# Patient Record
Sex: Female | Born: 1950 | Race: White | Hispanic: No | Marital: Married | Smoking: Never smoker
Health system: Southern US, Community
[De-identification: ages and names within clinical notes are randomized; demographics above are authoritative.]

## PROBLEM LIST (undated history)

## (undated) DIAGNOSIS — Z780 Asymptomatic menopausal state: Secondary | ICD-10-CM

## (undated) DIAGNOSIS — G4730 Sleep apnea, unspecified: Secondary | ICD-10-CM

## (undated) DIAGNOSIS — M26609 Unspecified temporomandibular joint disorder, unspecified side: Secondary | ICD-10-CM

## (undated) DIAGNOSIS — S32009A Unspecified fracture of unspecified lumbar vertebra, initial encounter for closed fracture: Secondary | ICD-10-CM

## (undated) DIAGNOSIS — G629 Polyneuropathy, unspecified: Secondary | ICD-10-CM

## (undated) DIAGNOSIS — T7840XA Allergy, unspecified, initial encounter: Secondary | ICD-10-CM

## (undated) HISTORY — PX: DILATION AND CURETTAGE OF UTERUS: SHX78

## (undated) HISTORY — DX: Allergy, unspecified, initial encounter: T78.40XA

## (undated) HISTORY — DX: Asymptomatic menopausal state: Z78.0

## (undated) HISTORY — PX: TONSILLECTOMY: SUR1361

## (undated) HISTORY — DX: Polyneuropathy, unspecified: G62.9

## (undated) HISTORY — DX: Unspecified fracture of unspecified lumbar vertebra, initial encounter for closed fracture: S32.009A

## (undated) HISTORY — DX: Unspecified temporomandibular joint disorder, unspecified side: M26.609

## (undated) HISTORY — DX: Sleep apnea, unspecified: G47.30

---

## 2003-02-18 ENCOUNTER — Encounter
Admission: RE | Admit: 2003-02-18 | Discharge: 2003-03-15 | Payer: Self-pay | Admitting: Physical Medicine and Rehabilitation

## 2008-02-09 DIAGNOSIS — G4730 Sleep apnea, unspecified: Secondary | ICD-10-CM

## 2008-02-09 HISTORY — DX: Sleep apnea, unspecified: G47.30

## 2013-02-09 ENCOUNTER — Encounter (INDEPENDENT_AMBULATORY_CARE_PROVIDER_SITE_OTHER): Payer: Self-pay

## 2013-02-09 ENCOUNTER — Ambulatory Visit (INDEPENDENT_AMBULATORY_CARE_PROVIDER_SITE_OTHER): Payer: BC Managed Care – PPO | Admitting: Family Medicine

## 2013-02-09 ENCOUNTER — Encounter: Payer: Self-pay | Admitting: Family Medicine

## 2013-02-09 ENCOUNTER — Ambulatory Visit (INDEPENDENT_AMBULATORY_CARE_PROVIDER_SITE_OTHER): Payer: BC Managed Care – PPO

## 2013-02-09 VITALS — BP 129/73 | HR 81 | Temp 98.2°F | Ht 65.0 in | Wt 188.0 lb

## 2013-02-09 DIAGNOSIS — N951 Menopausal and female climacteric states: Secondary | ICD-10-CM

## 2013-02-09 DIAGNOSIS — G4730 Sleep apnea, unspecified: Secondary | ICD-10-CM | POA: Insufficient documentation

## 2013-02-09 DIAGNOSIS — Z780 Asymptomatic menopausal state: Secondary | ICD-10-CM | POA: Insufficient documentation

## 2013-02-09 DIAGNOSIS — M542 Cervicalgia: Secondary | ICD-10-CM

## 2013-02-09 DIAGNOSIS — M26609 Unspecified temporomandibular joint disorder, unspecified side: Secondary | ICD-10-CM | POA: Insufficient documentation

## 2013-02-09 DIAGNOSIS — S32009A Unspecified fracture of unspecified lumbar vertebra, initial encounter for closed fracture: Secondary | ICD-10-CM | POA: Insufficient documentation

## 2013-02-09 MED ORDER — PREDNISONE 20 MG PO TABS: 60.0000 mg | ORAL_TABLET | Freq: Every day | ORAL | Status: DC

## 2013-02-09 NOTE — Patient Instructions (Signed)
Cervical Radiculopathy  Cervical radiculopathy happens when a nerve in the neck is pinched or bruised by a slipped (herniated) disk or by arthritic changes in the bones of the cervical spine. This can occur due to an injury or as part of the normal aging process. Pressure on the cervical nerves can cause pain or numbness that runs from your neck all the way down into your arm and fingers.  CAUSES   There are many possible causes, including:  · Injury.  · Muscle tightness in the neck from overuse.  · Swollen, painful joints (arthritis).  · Breakdown or degeneration in the bones and joints of the spine (spondylosis) due to aging.  · Bone spurs that may develop near the cervical nerves.  SYMPTOMS   Symptoms include pain, weakness, or numbness in the affected arm and hand. Pain can be severe or irritating. Symptoms may be worse when extending or turning the neck.  DIAGNOSIS   Your caregiver will ask about your symptoms and do a physical exam. He or she may test your strength and reflexes. X-rays, CT scans, and MRI scans may be needed in cases of injury or if the symptoms do not go away after a period of time. Electromyography (EMG) or nerve conduction testing may be done to study how your nerves and muscles are working.  TREATMENT   Your caregiver may recommend certain exercises to help relieve your symptoms. Cervical radiculopathy can, and often does, get better with time and treatment. If your problems continue, treatment options may include:  · Wearing a soft collar for short periods of time.  · Physical therapy to strengthen the neck muscles.  · Medicines, such as nonsteroidal anti-inflammatory drugs (NSAIDs), oral corticosteroids, or spinal injections.  · Surgery. Different types of surgery may be done depending on the cause of your problems.  HOME CARE INSTRUCTIONS   · Put ice on the affected area.  · Put ice in a plastic bag.  · Place a towel between your skin and the bag.  · Leave the ice on for 15-20 minutes,  03-04 times a day or as directed by your caregiver.  · If ice does not help, you can try using heat. Take a warm shower or bath, or use a hot water bottle as directed by your caregiver.  · You may try a gentle neck and shoulder massage.  · Use a flat pillow when you sleep.  · Only take over-the-counter or prescription medicines for pain, discomfort, or fever as directed by your caregiver.  · If physical therapy was prescribed, follow your caregiver's directions.  · If a soft collar was prescribed, use it as directed.  SEEK IMMEDIATE MEDICAL CARE IF:   · Your pain gets much worse and cannot be controlled with medicines.  · You have weakness or numbness in your hand, arm, face, or leg.  · You have a high fever or a stiff, rigid neck.  · You lose bowel or bladder control (incontinence).  · You have trouble with walking, balance, or speaking.  MAKE SURE YOU:   · Understand these instructions.  · Will watch your condition.  · Will get help right away if you are not doing well or get worse.  Document Released: 10/20/2000 Document Revised: 04/19/2011 Document Reviewed: 09/08/2010  Exit[employer]® Patient Information ©2014 [employer].

## 2013-02-09 NOTE — Progress Notes (Signed)
Patient ID: Kristen Becker, female   DOB: 11-18-1950, 63 y.o.   MRN: [id number] SUBJECTIVE: CC: Chief Complaint  Patient presents with  . Follow-up    trouble breathing with the cpap machine states the mask pulling her jaw pushing forward c/o pain in occipital area.   . Back Pain    also c/o back pain     HPI: Having neck pain at nights, radiates to the arms shoulders and back of the neck and even jaw sometimes and gets a headache. She attributes it to her CPAP mask. Difficulty turning her neck. Sees a dentist who recommends a mouth guard for TMJ as well and recommends seeinga  PCP in regards to the cpap mask.   Past Medical History  Diagnosis Date  . Sleep apnea   . Lumbar vertebral fracture     L1 from a fall.  . Menopause   . TMJ (temporomandibular joint syndrome)    No past surgical history on file. History   Social History  . Marital Status: Married    Spouse Name: N/A    Number of Children: N/A  . Years of Education: N/A   Occupational History  . Not on file.   Social History Main Topics  . Smoking status: Never Smoker   . Smokeless tobacco: Not on file  . Alcohol Use: Not on file  . Drug Use: Not on file  . Sexual Activity: Not on file   Other Topics Concern  . Not on file   Social History Narrative  . No narrative on file   No family history on file. No current outpatient prescriptions on file prior to visit.   No current facility-administered medications on file prior to visit.   No Known Allergies  There is no immunization history on file for this patient. Prior to Admission medications   Medication Sig Start Date End Date Taking? Authorizing Provider  trimethoprim (TRIMPEX) 100 MG tablet  02/03/13  Yes Historical Provider, MD     ROS: As above in the HPI. All other systems are stable or negative.  OBJECTIVE: APPEARANCE:  Patient in no acute distress.The patient appeared well nourished and normally developed. Acyanotic. Waist: VITAL  SIGNS:BP 129/73  Pulse 81  Temp(Src) 98.2 F (36.8 C) (Oral)  Ht 5\' 5"  (1.651 m)  Wt 188 lb (85.276 kg)  BMI 31.28 kg/m2   SKIN: warm and  Dry without overt rashes, tattoos and scars  HEAD and Neck: without JVD, Head and scalp: normal Eyes:No scleral icterus. Fundi normal, eye movements normal. Ears: Auricle normal, canal normal, Tympanic membranes normal, insufflation normal. Nose: normal Throat: normal Neck: reduced ROM to 30 degrees only. More reduced and more pain rotating to the right.  thyroid: normal  CHEST & LUNGS: Chest wall: normal Lungs: Clear  CVS: Reveals the PMI to be normally located. Regular rhythm, First and Second Heart sounds are normal,  absence of murmurs, rubs or gallops. Peripheral vasculature: Radial pulses: normal Dorsal pedis pulses: normal Posterior pulses: normal  ABDOMEN:  Appearance: normal Benign, no organomegaly, no masses, no Abdominal Aortic enlargement. No Guarding , no rebound. No Bruits. Bowel sounds: normal  RECTAL: N/A GU: N/A  EXTREMETIES: nonedematous.  MUSCULOSKELETAL:  Spine: cervical reduced ROM: and pain Joints:TMJ  intact  NEUROLOGIC: oriented to time,place and person; nonfocal. Strength is normal Sensory is normal. Radicular pain from the neck. Reflexes are normal Cranial Nerves are normal.  ASSESSMENT:   Neck pain on right side - Plan: DG Cervical Spine Complete, predniSONE (DELTASONE)  20 MG tablet, Ambulatory referral to Physical Therapy  Lumbar vertebral fracture  Menopause  Sleep apnea  TMJ (temporomandibular joint syndrome)  PLAN: Hand out on cervical radiculopathy  Orders Placed This Encounter  Procedures  . DG Cervical Spine Complete    Standing Status: Future     Number of Occurrences: 1     Standing Expiration Date: 04/11/2014    Order Specific Question:  Reason for Exam (SYMPTOM  OR DIAGNOSIS REQUIRED)    Answer:  neck pain    Order Specific Question:  Preferred imaging location?     Answer:  Internal  . Ambulatory referral to Physical Therapy    Referral Priority:  Routine    Referral Type:  Physical Medicine    Referral Reason:  Specialty Services Required    Requested Specialty:  Physical Therapy    Number of Visits Requested:  1  [facility] reading (PRIMARY) by  Dr. Jacelyn Grip: significant Degenerative changes of the C5-C7. With straightening of thE Cervical SPINE.                                   Meds ordered this encounter  Medications  . trimethoprim (TRIMPEX) 100 MG tablet    Sig:   . ciprofloxacin (CIPRO) 500 MG tablet    Sig: Take 500 mg by mouth 2 (two) times daily.  Marland Kitchen Bioflavonoid Products (GRAPE SEED PO)    Sig: Take 2 capsules by mouth. muscadine grape seed  . Misc Natural Products (SUPER GREENS) POWD    Sig: Take 2 scoop by mouth.  . Vitamins C E (CRANBERRY CONCENTRATE PO)    Sig: Take by mouth. 1 tablespoon juice nitely  . Cholecalciferol (VITAMIN D) 2000 UNITS CAPS    Sig: Take 1 capsule by mouth.  . zinc gluconate 50 MG tablet    Sig: Take 50 mg by mouth daily.  . Biotin (BIOTIN 5000) 5 MG CAPS    Sig: Take 1 capsule by mouth.  . estradiol (ESTRACE) 0.1 MG/GM vaginal cream    Sig: Place 1 Applicatorful vaginally once a week. 2x week  . acetaminophen (TYLENOL) 500 MG tablet    Sig: Take 500 mg by mouth every 6 (six) hours as needed.  Marland Kitchen DISCONTD: ibuprofen (ADVIL) 200 MG tablet    Sig: Take 200 mg by mouth every 6 (six) hours as needed.  . predniSONE (DELTASONE) 20 MG tablet    Sig: Take 3 tablets (60 mg total) by mouth daily with breakfast. For 3 days, then 2 tabs daily for 2 days , then 1 tablet daily for 2 days.    Dispense:  15 tablet    Refill:  0                        Discussed with patient and her husband that her main problem is the neck. The TMJ is  Next and this affects her sleep and the CPAP> Will see how she does with PT and steroids pulse therapy.   Medications Discontinued During This Encounter  Medication Reason  . ibuprofen  (ADVIL) 200 MG tablet Change in therapy   Return in about 2 weeks (around 02/23/2013) for Recheck medical problems.  Taiana Temkin P. Jacelyn Grip, M.D.

## 2013-02-20 ENCOUNTER — Ambulatory Visit: Payer: BC Managed Care – PPO | Attending: Family Medicine | Admitting: Physical Therapy

## 2013-02-20 DIAGNOSIS — IMO0001 Reserved for inherently not codable concepts without codable children: Secondary | ICD-10-CM | POA: Insufficient documentation

## 2013-02-23 ENCOUNTER — Ambulatory Visit: Payer: BC Managed Care – PPO | Admitting: *Deleted

## 2013-02-27 ENCOUNTER — Ambulatory Visit: Payer: BC Managed Care – PPO | Admitting: *Deleted

## 2013-02-27 ENCOUNTER — Ambulatory Visit (INDEPENDENT_AMBULATORY_CARE_PROVIDER_SITE_OTHER): Payer: BC Managed Care – PPO | Admitting: Family Medicine

## 2013-02-27 ENCOUNTER — Encounter: Payer: Self-pay | Admitting: Family Medicine

## 2013-02-27 VITALS — BP 120/64 | HR 62 | Temp 97.9°F | Ht 65.0 in | Wt 186.4 lb

## 2013-02-27 DIAGNOSIS — G4730 Sleep apnea, unspecified: Secondary | ICD-10-CM

## 2013-02-27 DIAGNOSIS — Z780 Asymptomatic menopausal state: Secondary | ICD-10-CM

## 2013-02-27 DIAGNOSIS — S32009A Unspecified fracture of unspecified lumbar vertebra, initial encounter for closed fracture: Secondary | ICD-10-CM

## 2013-02-27 DIAGNOSIS — N951 Menopausal and female climacteric states: Secondary | ICD-10-CM

## 2013-02-27 DIAGNOSIS — M26609 Unspecified temporomandibular joint disorder, unspecified side: Secondary | ICD-10-CM

## 2013-02-27 DIAGNOSIS — M5412 Radiculopathy, cervical region: Secondary | ICD-10-CM

## 2013-02-27 NOTE — Progress Notes (Signed)
Patient ID: Kristen Becker, female   DOB: [birth year], 63 y.o.   MRN: [id number] SUBJECTIVE: CC: Chief Complaint  Patient presents with  . Follow-up    2 week follow up    HPI:  Saw advanced home health nurse. Has a different mask.doing well Needs a referral to sleep specialist.  Neck and the pain is better.  Has a TENS unit on order.    Past Medical History  Diagnosis Date  . Sleep apnea   . Lumbar vertebral fracture     L1 from a fall.  . Menopause   . TMJ (temporomandibular joint syndrome)    No past surgical history on file. History   Social History  . Marital Status: Married    Spouse Name: N/A    Number of Children: N/A  . Years of Education: N/A   Occupational History  . Not on file.   Social History Main Topics  . Smoking status: Never Smoker   . Smokeless tobacco: Not on file  . Alcohol Use: Not on file  . Drug Use: Not on file  . Sexual Activity: Not on file   Other Topics Concern  . Not on file   Social History Narrative  . No narrative on file   No family history on file. Current Outpatient Prescriptions on File Prior to Visit  Medication Sig Dispense Refill  . acetaminophen (TYLENOL) 500 MG tablet Take 500 mg by mouth every 6 (six) hours as needed.      Marland Kitchen Bioflavonoid Products (GRAPE SEED PO) Take 2 capsules by mouth. muscadine grape seed      . Biotin (BIOTIN 5000) 5 MG CAPS Take 1 capsule by mouth.      . Cholecalciferol (VITAMIN D) 2000 UNITS CAPS Take 1 capsule by mouth.      . ciprofloxacin (CIPRO) 500 MG tablet Take 500 mg by mouth 2 (two) times daily.      Marland Kitchen estradiol (ESTRACE) 0.1 MG/GM vaginal cream Place 1 Applicatorful vaginally once a week. 2x week      . Misc Natural Products (SUPER GREENS) POWD Take 2 scoop by mouth.      . predniSONE (DELTASONE) 20 MG tablet Take 3 tablets (60 mg total) by mouth daily with breakfast. For 3 days, then 2 tabs daily for 2 days , then 1 tablet daily for 2 days.  15 tablet  0  . trimethoprim (TRIMPEX)  100 MG tablet       . Vitamins C E (CRANBERRY CONCENTRATE PO) Take by mouth. 1 tablespoon juice nitely      . zinc gluconate 50 MG tablet Take 50 mg by mouth daily.       No current facility-administered medications on file prior to visit.   Allergies  Allergen Reactions  . Codeine Rash  . Sulfa Antibiotics Rash    There is no immunization history on file for this patient. Prior to Admission medications   Medication Sig Start Date End Date Taking? Authorizing Provider  acetaminophen (TYLENOL) 500 MG tablet Take 500 mg by mouth every 6 (six) hours as needed.    Historical Provider, MD  Bioflavonoid Products (GRAPE SEED PO) Take 2 capsules by mouth. muscadine grape seed    Historical Provider, MD  Biotin (BIOTIN 5000) 5 MG CAPS Take 1 capsule by mouth.    Historical Provider, MD  Cholecalciferol (VITAMIN D) 2000 UNITS CAPS Take 1 capsule by mouth.    Historical Provider, MD  ciprofloxacin (CIPRO) 500 MG tablet Take 500 mg by  mouth 2 (two) times daily.    Historical Provider, MD  estradiol (ESTRACE) 0.1 MG/GM vaginal cream Place 1 Applicatorful vaginally once a week. 2x week    Historical Provider, MD  Misc Natural Products (SUPER GREENS) POWD Take 2 scoop by mouth.    Historical Provider, MD  predniSONE (DELTASONE) 20 MG tablet Take 3 tablets (60 mg total) by mouth daily with breakfast. For 3 days, then 2 tabs daily for 2 days , then 1 tablet daily for 2 days. 02/09/13   Vernie Shanks, MD  trimethoprim (TRIMPEX) 100 MG tablet  02/03/13   Historical Provider, MD  Vitamins C E (CRANBERRY CONCENTRATE PO) Take by mouth. 1 tablespoon juice nitely    Historical Provider, MD  zinc gluconate 50 MG tablet Take 50 mg by mouth daily.    Historical Provider, MD     ROS: As above in the HPI. All other systems are stable or negative.  OBJECTIVE: APPEARANCE:  Patient in no acute distress.The patient appeared well nourished and normally developed. Acyanotic. Waist: VITAL SIGNS:BP 120/64  Pulse 62   Temp(Src) 97.9 F (36.6 C) (Oral)  Ht 5\' 5"  (1.651 m)  Wt 186 lb 6.4 oz (84.55 kg)  BMI 31.02 kg/m2 WF  SKIN: warm and  Dry without overt rashes, tattoos and scars  HEAD and Neck: without JVD, Head and scalp: normal Eyes:No scleral icterus. Fundi normal, eye movements normal. Ears: Auricle normal, canal normal, Tympanic membranes normal, insufflation normal. Nose: normal Throat: normal thyroid: normal Neck: much more improved ROM. Reduced neck pain.  CHEST & LUNGS: Chest wall: normal Lungs: Clear  CVS: Reveals the PMI to be normally located. Regular rhythm, First and Second Heart sounds are normal,  absence of murmurs, rubs or gallops. Peripheral vasculature: Radial pulses: normal Dorsal pedis pulses: normal Posterior pulses: normal  ABDOMEN:  Appearance: normal Benign, no organomegaly, no masses, no Abdominal Aortic enlargement. No Guarding , no rebound. No Bruits. Bowel sounds: normal  RECTAL: N/A GU: N/A  EXTREMETIES: nonedematous.  MUSCULOSKELETAL:  Spine: normal Joints: intact  NEUROLOGIC: oriented to time,place and person; nonfocal. Strength is normal Sensory is normal Reflexes are normal Cranial Nerves are normal.  ASSESSMENT:  Cervical radiculopathy  Lumbar vertebral fracture  Menopause  TMJ (temporomandibular joint syndrome)  Sleep apnea - Plan: Ambulatory referral to Pulmonology  PLAN:  Orders Placed This Encounter  Procedures  . Ambulatory referral to Pulmonology    Referral Priority:  Routine    Referral Type:  Consultation    Referral Reason:  Specialty Services Required    Requested Specialty:  Pulmonary Disease    Number of Visits Requested:  1   Meds ordered this encounter  Medications  . aspirin 81 MG chewable tablet    Sig: 81 mg.  . Multiple Vitamin (MULTIVITAMIN) capsule    Sig: 1 capsule.  continue with PT .  There are no discontinued medications. Return in about 4 weeks (around 03/27/2013) for Recheck medical  problems.  Daeja Helderman P. Jacelyn Grip, M.D.

## 2013-03-02 ENCOUNTER — Ambulatory Visit: Payer: BC Managed Care – PPO | Admitting: Physical Therapy

## 2013-03-06 ENCOUNTER — Ambulatory Visit: Payer: BC Managed Care – PPO | Admitting: *Deleted

## 2013-03-08 ENCOUNTER — Encounter: Payer: Self-pay | Admitting: Family Medicine

## 2013-03-09 ENCOUNTER — Ambulatory Visit: Payer: BC Managed Care – PPO | Admitting: *Deleted

## 2013-03-12 ENCOUNTER — Ambulatory Visit: Payer: BC Managed Care – PPO | Attending: Family Medicine | Admitting: Physical Therapy

## 2013-03-12 DIAGNOSIS — M542 Cervicalgia: Secondary | ICD-10-CM | POA: Insufficient documentation

## 2013-03-12 DIAGNOSIS — IMO0001 Reserved for inherently not codable concepts without codable children: Secondary | ICD-10-CM | POA: Insufficient documentation

## 2013-03-12 DIAGNOSIS — R5381 Other malaise: Secondary | ICD-10-CM | POA: Insufficient documentation

## 2013-03-16 ENCOUNTER — Ambulatory Visit: Payer: BC Managed Care – PPO | Admitting: Physical Therapy

## 2013-03-20 ENCOUNTER — Ambulatory Visit: Payer: BC Managed Care – PPO | Admitting: Physical Therapy

## 2013-03-23 ENCOUNTER — Ambulatory Visit: Payer: BC Managed Care – PPO | Admitting: Physical Therapy

## 2013-03-27 ENCOUNTER — Ambulatory Visit: Payer: BC Managed Care – PPO | Admitting: Family Medicine

## 2013-03-29 ENCOUNTER — Encounter: Payer: Self-pay | Admitting: Family Medicine

## 2013-03-29 ENCOUNTER — Ambulatory Visit (INDEPENDENT_AMBULATORY_CARE_PROVIDER_SITE_OTHER): Payer: BC Managed Care – PPO | Admitting: Family Medicine

## 2013-03-29 VITALS — BP 111/58 | HR 74 | Temp 97.4°F | Ht 65.0 in | Wt 192.0 lb

## 2013-03-29 DIAGNOSIS — Z780 Asymptomatic menopausal state: Secondary | ICD-10-CM

## 2013-03-29 DIAGNOSIS — M5412 Radiculopathy, cervical region: Secondary | ICD-10-CM

## 2013-03-29 DIAGNOSIS — M26609 Unspecified temporomandibular joint disorder, unspecified side: Secondary | ICD-10-CM

## 2013-03-29 DIAGNOSIS — Z13220 Encounter for screening for lipoid disorders: Secondary | ICD-10-CM

## 2013-03-29 DIAGNOSIS — G4730 Sleep apnea, unspecified: Secondary | ICD-10-CM

## 2013-03-29 DIAGNOSIS — T887XXA Unspecified adverse effect of drug or medicament, initial encounter: Secondary | ICD-10-CM

## 2013-03-29 DIAGNOSIS — N951 Menopausal and female climacteric states: Secondary | ICD-10-CM

## 2013-03-29 NOTE — Progress Notes (Signed)
Patient ID: Kristen Becker, female   DOB: [birth year], 63 y.o.   MRN: [id number] SUBJECTIVE: CC: Chief Complaint  Patient presents with  . Follow-up    4 week ck up saw urologist from [place]     HPI: 1) cervical radiculopathy : 80 % better. Stopped short of 2 PT sessions. Doing so much better 2) saw a Urologist:  At [place] and Rx antibiotic. Developed a yeast infection. Will use OTC products.may need Diflucan if not responding. 3) prefers to see Pulmonary in [place] for follow up of OSA. Requests referral to [facility] chest clinic.  Past Medical History  Diagnosis Date  . Sleep apnea   . Lumbar vertebral fracture     L1 from a fall.  . Menopause   . TMJ (temporomandibular joint syndrome)    No past surgical history on file. History   Social History  . Marital Status: Married    Spouse Name: N/A    Number of Children: N/A  . Years of Education: N/A   Occupational History  . Not on file.   Social History Main Topics  . Smoking status: Never Smoker   . Smokeless tobacco: Not on file  . Alcohol Use: Not on file  . Drug Use: Not on file  . Sexual Activity: Not on file   Other Topics Concern  . Not on file   Social History Narrative  . No narrative on file   No family history on file. Current Outpatient Prescriptions on File Prior to Visit  Medication Sig Dispense Refill  . Bioflavonoid Products (GRAPE SEED PO) Take 2 capsules by mouth. muscadine grape seed      . Biotin (BIOTIN 5000) 5 MG CAPS Take 1 capsule by mouth.      . Cholecalciferol (VITAMIN D) 2000 UNITS CAPS Take 1 capsule by mouth.      . ciprofloxacin (CIPRO) 500 MG tablet Take 500 mg by mouth 2 (two) times daily.      Marland Kitchen estradiol (ESTRACE) 0.1 MG/GM vaginal cream Place 1 Applicatorful vaginally once a week. 2x week      . trimethoprim (TRIMPEX) 100 MG tablet       . acetaminophen (TYLENOL) 500 MG tablet Take 500 mg by mouth every 6 (six) hours as needed.      Marland Kitchen aspirin 81 MG chewable tablet 81 mg.      . Misc  Natural Products (SUPER GREENS) POWD Take 2 scoop by mouth.      . Multiple Vitamin (MULTIVITAMIN) capsule 1 capsule.      . predniSONE (DELTASONE) 20 MG tablet Take 3 tablets (60 mg total) by mouth daily with breakfast. For 3 days, then 2 tabs daily for 2 days , then 1 tablet daily for 2 days.  15 tablet  0  . Vitamins C E (CRANBERRY CONCENTRATE PO) Take by mouth. 1 tablespoon juice nitely      . zinc gluconate 50 MG tablet Take 50 mg by mouth daily.       No current facility-administered medications on file prior to visit.   Allergies  Allergen Reactions  . Codeine Rash  . Sulfa Antibiotics Rash    There is no immunization history on file for this patient. Prior to Admission medications   Medication Sig Start Date End Date Taking? Authorizing Provider  Bioflavonoid Products (GRAPE SEED PO) Take 2 capsules by mouth. muscadine grape seed   Yes Historical Provider, MD  Biotin (BIOTIN 5000) 5 MG CAPS Take 1 capsule by mouth.  Yes Historical Provider, MD  Cholecalciferol (VITAMIN D) 2000 UNITS CAPS Take 1 capsule by mouth.   Yes Historical Provider, MD  ciprofloxacin (CIPRO) 500 MG tablet Take 500 mg by mouth 2 (two) times daily.   Yes Historical Provider, MD  estradiol (ESTRACE) 0.1 MG/GM vaginal cream Place 1 Applicatorful vaginally once a week. 2x week   Yes Historical Provider, MD  trimethoprim (TRIMPEX) 100 MG tablet  02/03/13  Yes Historical Provider, MD  acetaminophen (TYLENOL) 500 MG tablet Take 500 mg by mouth every 6 (six) hours as needed.    Historical Provider, MD  aspirin 81 MG chewable tablet 81 mg.    Historical Provider, MD  Misc Natural Products (SUPER GREENS) POWD Take 2 scoop by mouth.    Historical Provider, MD  Multiple Vitamin (MULTIVITAMIN) capsule 1 capsule.    Historical Provider, MD  predniSONE (DELTASONE) 20 MG tablet Take 3 tablets (60 mg total) by mouth daily with breakfast. For 3 days, then 2 tabs daily for 2 days , then 1 tablet daily for 2 days. 02/09/13    Vernie Shanks, MD  Vitamins C E (CRANBERRY CONCENTRATE PO) Take by mouth. 1 tablespoon juice nitely    Historical Provider, MD  zinc gluconate 50 MG tablet Take 50 mg by mouth daily.    Historical Provider, MD     ROS: As above in the HPI. All other systems are stable or negative.  OBJECTIVE: APPEARANCE:  Patient in no acute distress.The patient appeared well nourished and normally developed. Acyanotic. Waist: VITAL SIGNS:BP 111/58  Pulse 74  Temp(Src) 97.4 F (36.3 C) (Oral)  Ht '5\' 5"'  (1.651 m)  Wt 192 lb (87.091 kg)  BMI 31.95 kg/m2 Obese WF  SKIN: warm and  Dry without overt rashes, tattoos and scars  HEAD and Neck: without JVD, Head and scalp: normal Eyes:No scleral icterus. Fundi normal, eye movements normal. Ears: Auricle normal, canal normal, Tympanic membranes normal, insufflation normal. Nose: normal Throat: normal Neck: better ROM. thyroid: normal  CHEST & LUNGS: Chest wall: normal Lungs: Clear  CVS: Reveals the PMI to be normally located. Regular rhythm, First and Second Heart sounds are normal,  absence of murmurs, rubs or gallops. Peripheral vasculature: Radial pulses: normal Dorsal pedis pulses: normal Posterior pulses: normal  ABDOMEN:  Appearance: normal Benign, no organomegaly, no masses, no Abdominal Aortic enlargement. No Guarding , no rebound. No Bruits. Bowel sounds: normal  RECTAL: N/A GU: N/A  EXTREMETIES: nonedematous.  MUSCULOSKELETAL:  Spine: cervical is better with improved ROM Joints: intact  NEUROLOGIC: oriented to time,place and person; nonfocal. Strength is normal Sensory is normal Reflexes are normal Cranial Nerves are normal.  ASSESSMENT:  Sleep apnea - Plan: Ambulatory referral to Pulmonology, CMP14+EGFR  Cervical radiculopathy  TMJ (temporomandibular joint syndrome)  Menopause - Plan: CMP14+EGFR  Medication side effect - Plan: CMP14+EGFR  Screening cholesterol level - Plan: Lipid panel Overall much  better and improved. Agree with D/C PT and she will continue with a home program and TENS unit. Even her TMJ is better with the mouth piece for her OSA.  PLAN: Continue with home PT and TENS unit.  Husband to help with neck traction and massages 3x a week.  Orders Placed This Encounter  Procedures  . CMP14+EGFR    Standing Status: Future     Number of Occurrences:      Standing Expiration Date: 03/29/2014    Order Specific Question:  Has the patient fasted?    Answer:  Yes  . Lipid panel  Standing Status: Future     Number of Occurrences:      Standing Expiration Date: 03/29/2014    Order Specific Question:  Has the patient fasted?    Answer:  Yes  . Ambulatory referral to Pulmonology    Referral Priority:  Routine    Referral Type:  Consultation    Referral Reason:  Specialty Services Required    Requested Specialty:  Pulmonary Disease    Number of Visits Requested:  1   No orders of the defined types were placed in this encounter.   There are no discontinued medications. Return if symptoms worsen or fail to improve.  Abdirahim Flavell P. Jacelyn Grip, M.D.

## 2013-04-02 ENCOUNTER — Telehealth: Payer: Self-pay | Admitting: Family Medicine

## 2013-04-04 ENCOUNTER — Other Ambulatory Visit: Payer: Self-pay | Admitting: Family Medicine

## 2013-04-04 ENCOUNTER — Encounter: Payer: Self-pay | Admitting: Family Medicine

## 2013-04-04 ENCOUNTER — Other Ambulatory Visit (INDEPENDENT_AMBULATORY_CARE_PROVIDER_SITE_OTHER): Payer: BC Managed Care – PPO

## 2013-04-04 DIAGNOSIS — N951 Menopausal and female climacteric states: Secondary | ICD-10-CM

## 2013-04-04 DIAGNOSIS — Z780 Asymptomatic menopausal state: Secondary | ICD-10-CM

## 2013-04-04 DIAGNOSIS — G4730 Sleep apnea, unspecified: Secondary | ICD-10-CM

## 2013-04-04 DIAGNOSIS — T887XXA Unspecified adverse effect of drug or medicament, initial encounter: Secondary | ICD-10-CM

## 2013-04-04 DIAGNOSIS — Z13220 Encounter for screening for lipoid disorders: Secondary | ICD-10-CM

## 2013-04-04 MED ORDER — FLUCONAZOLE 150 MG PO TABS: 150.0000 mg | ORAL_TABLET | Freq: Once | ORAL | Status: DC

## 2013-04-04 NOTE — Progress Notes (Signed)
Pt came in for labs only 

## 2013-04-04 NOTE — Telephone Encounter (Signed)
Detailed message left

## 2013-04-04 NOTE — Telephone Encounter (Signed)
Call patient : Prescription refilled & sent to pharmacy ( [facility]) in [place].

## 2013-04-05 LAB — LIPID PANEL
Chol/HDL Ratio: 2.3 ratio units (ref 0.0–4.4)
Cholesterol, Total: 203 mg/dL — ABNORMAL HIGH (ref 100–199)
HDL: 88 mg/dL (ref >39)
LDL Calculated: 102 mg/dL — ABNORMAL HIGH (ref 0–99)
Triglycerides: 64 mg/dL (ref 0–149)
VLDL Cholesterol Cal: 13 mg/dL (ref 5–40)

## 2013-04-05 LAB — CMP14+EGFR
ALT: 17 IU/L (ref 0–32)
AST: 16 IU/L (ref 0–40)
Albumin/Globulin Ratio: 2.3 (ref 1.1–2.5)
Albumin: 4.4 g/dL (ref 3.6–4.8)
Alkaline Phosphatase: 64 IU/L (ref 39–117)
BUN/Creatinine Ratio: 18 (ref 11–26)
BUN: 14 mg/dL (ref 8–27)
CO2: 25 mmol/L (ref 18–29)
Calcium: 9.3 mg/dL (ref 8.7–10.3)
Chloride: 101 mmol/L (ref 97–108)
Creatinine, Ser: 0.78 mg/dL (ref 0.57–1.00)
GFR calc Af Amer: 94 mL/min/{1.73_m2} (ref >59)
GFR calc non Af Amer: 82 mL/min/{1.73_m2} (ref >59)
Globulin, Total: 1.9 g/dL (ref 1.5–4.5)
Glucose: 87 mg/dL (ref 65–99)
Potassium: 4 mmol/L (ref 3.5–5.2)
Sodium: 141 mmol/L (ref 134–144)
Total Bilirubin: 0.4 mg/dL (ref 0.0–1.2)
Total Protein: 6.3 g/dL (ref 6.0–8.5)

## 2013-08-08 ENCOUNTER — Ambulatory Visit (INDEPENDENT_AMBULATORY_CARE_PROVIDER_SITE_OTHER): Payer: BC Managed Care – PPO | Admitting: Family

## 2013-08-08 ENCOUNTER — Encounter: Payer: Self-pay | Admitting: Family

## 2013-08-08 ENCOUNTER — Ambulatory Visit (INDEPENDENT_AMBULATORY_CARE_PROVIDER_SITE_OTHER): Payer: BC Managed Care – PPO

## 2013-08-08 VITALS — BP 129/72 | HR 76 | Temp 99.2°F | Ht 65.0 in | Wt 191.6 lb

## 2013-08-08 DIAGNOSIS — M79604 Pain in right leg: Secondary | ICD-10-CM

## 2013-08-08 DIAGNOSIS — M545 Low back pain, unspecified: Secondary | ICD-10-CM

## 2013-08-08 DIAGNOSIS — G8929 Other chronic pain: Secondary | ICD-10-CM

## 2013-08-08 DIAGNOSIS — M7730 Calcaneal spur, unspecified foot: Secondary | ICD-10-CM

## 2013-08-08 DIAGNOSIS — M79609 Pain in unspecified limb: Secondary | ICD-10-CM

## 2013-08-08 DIAGNOSIS — M7731 Calcaneal spur, right foot: Secondary | ICD-10-CM

## 2013-08-08 MED ORDER — MELOXICAM 15 MG PO TABS: 15.0000 mg | ORAL_TABLET | Freq: Every day | ORAL | Status: DC

## 2013-08-08 MED ORDER — CYCLOBENZAPRINE HCL 5 MG PO TABS: 5.0000 mg | ORAL_TABLET | Freq: Three times a day (TID) | ORAL | Status: DC | PRN

## 2013-08-08 NOTE — Progress Notes (Signed)
Subjective:    Patient ID: Kristen Becker, female    DOB: 1950/06/18, 63 y.o.   MRN: [id number]  Back Pain This is a chronic problem. The current episode started more than 1 year ago. The problem occurs constantly. The problem is unchanged. The pain is present in the lumbar spine. The quality of the pain is described as aching. The pain is at a severity of 7/10. The pain is moderate. The pain is worse during the day. The symptoms are aggravated by bending and standing. Associated symptoms include numbness. Pertinent negatives include no abdominal pain, fever, headaches or pelvic pain. Risk factors include recent trauma. She has tried NSAIDs (TED's) for the symptoms. The treatment provided mild relief.  Foot Injury  The incident occurred more than 1 week ago. The incident occurred in the yard. The injury mechanism was a direct blow. The pain is present in the right leg. The quality of the pain is described as aching. The pain is at a severity of 8/10. The pain is moderate. The pain has been worsening since onset. Associated symptoms include numbness. Pertinent negatives include no inability to bear weight or loss of motion. The symptoms are aggravated by weight bearing. She has tried NSAIDs and acetaminophen for the symptoms. The treatment provided mild relief.    *In the middle of May a log hit pt's right lower leg.   Review of Systems  Constitutional: Negative.  Negative for fever.  HENT: Negative.   Eyes: Negative.   Respiratory: Negative.  Negative for shortness of breath.   Cardiovascular: Negative.  Negative for palpitations.  Gastrointestinal: Negative.  Negative for abdominal pain.  Endocrine: Negative.   Genitourinary: Negative.  Negative for pelvic pain.  Musculoskeletal: Positive for back pain.  Neurological: Positive for numbness. Negative for headaches.  Hematological: Negative.   Psychiatric/Behavioral: Negative.   All other systems reviewed and are negative.        Objective:   Physical Exam  Vitals reviewed. Constitutional: She is oriented to person, place, and time. She appears well-developed and well-nourished. No distress.  HENT:  Head: Normocephalic and atraumatic.  Right Ear: External ear normal.  Mouth/Throat: Oropharynx is clear and moist.  Eyes: Pupils are equal, round, and reactive to light.  Neck: Normal range of motion. Neck supple. No thyromegaly present.  Cardiovascular: Normal rate, regular rhythm, normal heart sounds and intact distal pulses.   No murmur heard. Pulmonary/Chest: Effort normal and breath sounds normal. No respiratory distress. She has no wheezes.  Abdominal: Soft. Bowel sounds are normal. She exhibits no distension. There is no tenderness.  Musculoskeletal: Normal range of motion. She exhibits no edema and no tenderness.  Neurological: She is alert and oriented to person, place, and time. She has normal reflexes. No cranial nerve deficit.  Skin: Skin is warm and dry.  Psychiatric: She has a normal mood and affect. Her behavior is normal. Judgment and thought content normal.    X-ray- Lumbar WNL, Foot-small heel spur shown Preliminary reading by Evelina Dun, FNP [facility]   BP 129/72  Pulse 76  Temp(Src) 99.2 F (37.3 C) (Oral)  Ht _0  (1.651 m)  Wt 191 lb 9.6 oz (86.909 kg)  BMI 31.88 kg/m2  z    Assessment & Plan:  1. Chronic lower back pain -Rest -ice - DG Lumbar Spine 2-3 Views; Future -Discussed flexeril may cause drowsiness - cyclobenzaprine (FLEXERIL) 5 MG tablet; Take 1 tablet (5 mg total) by mouth 3 (three) times daily as needed for muscle spasms.  Dispense: 90 tablet; Refill: 1  2. Pain of right lower extremity - DG Foot Complete Right; Future  3. Heel spur, right -Stretching and exercises discussed -Correct fitting shoes - cyclobenzaprine (FLEXERIL) 5 MG tablet; Take 1 tablet (5 mg total) by mouth 3 (three) times daily as needed for muscle spasms.  Dispense: 90 tablet; Refill: 1 -  meloxicam (MOBIC) 15 MG tablet; Take 1 tablet (15 mg total) by mouth daily.  Dispense: 30 tablet; Refill: 0 - BMP8+EGFR  Evelina Dun, FNP

## 2013-08-08 NOTE — Patient Instructions (Signed)
Plantar Fasciitis (Heel Spur Syndrome) with Rehab The plantar fascia is a fibrous, ligament-like, soft-tissue structure that spans the bottom of the foot. Plantar fasciitis is a condition that causes pain in the foot due to inflammation of the tissue. SYMPTOMS   Pain and tenderness on the underneath side of the foot.  Pain that worsens with standing or walking. CAUSES  Plantar fasciitis is caused by irritation and injury to the plantar fascia on the underneath side of the foot. Common mechanisms of injury include:  Direct trauma to bottom of the foot.  Damage to a small nerve that runs under the foot where the main fascia attaches to the heel bone.  Stress placed on the plantar fascia due to bone spurs. RISK INCREASES WITH:   Activities that place stress on the plantar fascia (running, jumping, pivoting, or cutting).  Poor strength and flexibility.  Improperly fitted shoes.  Tight calf muscles.  Flat feet.  Failure to warm-up properly before activity.  Obesity. PREVENTION  Warm up and stretch properly before activity.  Allow for adequate recovery between workouts.  Maintain physical fitness:  Strength, flexibility, and endurance.  Cardiovascular fitness.  Maintain a health body weight.  Avoid stress on the plantar fascia.  Wear properly fitted shoes, including arch supports for individuals who have flat feet. PROGNOSIS  If treated properly, then the symptoms of plantar fasciitis usually resolve without surgery. However, occasionally surgery is necessary. RELATED COMPLICATIONS   Recurrent symptoms that may result in a chronic condition.  Problems of the lower back that are caused by compensating for the injury, such as limping.  Pain or weakness of the foot during push-off following surgery.  Chronic inflammation, scarring, and partial or complete fascia tear, occurring more often from repeated injections. TREATMENT  Treatment initially involves the use of  ice and medication to help reduce pain and inflammation. The use of strengthening and stretching exercises may help reduce pain with activity, especially stretches of the Achilles tendon. These exercises may be performed at home or with a therapist. Your caregiver may recommend that you use heel cups of arch supports to help reduce stress on the plantar fascia. Occasionally, corticosteroid injections are given to reduce inflammation. If symptoms persist for greater than 6 months despite non-surgical (conservative), then surgery may be recommended.  MEDICATION   If pain medication is necessary, then nonsteroidal anti-inflammatory medications, such as aspirin and ibuprofen, or other minor pain relievers, such as acetaminophen, are often recommended.  Do not take pain medication within 7 days before surgery.  Prescription pain relievers may be given if deemed necessary by your caregiver. Use only as directed and only as much as you need.  Corticosteroid injections may be given by your caregiver. These injections should be reserved for the most serious cases, because they may only be given a certain number of times. HEAT AND COLD  Cold treatment (icing) relieves pain and reduces inflammation. Cold treatment should be applied for 10 to 15 minutes every 2 to 3 hours for inflammation and pain and immediately after any activity that aggravates your symptoms. Use ice packs or massage the area with a piece of ice (ice massage).  Heat treatment may be used prior to performing the stretching and strengthening activities prescribed by your caregiver, physical therapist, or athletic trainer. Use a heat pack or soak the injury in warm water. SEEK IMMEDIATE MEDICAL CARE IF:  Treatment seems to offer no benefit, or the condition worsens.  Any medications produce adverse side effects. EXERCISES RANGE   OF MOTION (ROM) AND STRETCHING EXERCISES - Plantar Fasciitis (Heel Spur Syndrome) These exercises may help you  when beginning to rehabilitate your injury. Your symptoms may resolve with or without further involvement from your physician, physical therapist or athletic trainer. While completing these exercises, remember:   Restoring tissue flexibility helps normal motion to return to the joints. This allows healthier, less painful movement and activity.  An effective stretch should be held for at least 30 seconds.  A stretch should never be painful. You should only feel a gentle lengthening or release in the stretched tissue. RANGE OF MOTION - Toe Extension, Flexion  Sit with your right / left leg crossed over your opposite knee.  Grasp your toes and gently pull them back toward the top of your foot. You should feel a stretch on the bottom of your toes and/or foot.  Hold this stretch for __________ seconds.  Now, gently pull your toes toward the bottom of your foot. You should feel a stretch on the top of your toes and or foot.  Hold this stretch for __________ seconds. Repeat __________ times. Complete this stretch __________ times per day.  RANGE OF MOTION - Ankle Dorsiflexion, Active Assisted  Remove shoes and sit on a chair that is preferably not on a carpeted surface.  Place right / left foot under knee. Extend your opposite leg for support.  Keeping your heel down, slide your right / left foot back toward the chair until you feel a stretch at your ankle or calf. If you do not feel a stretch, slide your bottom forward to the edge of the chair, while still keeping your heel down.  Hold this stretch for __________ seconds. Repeat __________ times. Complete this stretch __________ times per day.  STRETCH - Gastroc, Standing  Place hands on wall.  Extend right / left leg, keeping the front knee somewhat bent.  Slightly point your toes inward on your back foot.  Keeping your right / left heel on the floor and your knee straight, shift your weight toward the wall, not allowing your back to  arch.  You should feel a gentle stretch in the right / left calf. Hold this position for __________ seconds. Repeat __________ times. Complete this stretch __________ times per day. STRETCH - Soleus, Standing  Place hands on wall.  Extend right / left leg, keeping the other knee somewhat bent.  Slightly point your toes inward on your back foot.  Keep your right / left heel on the floor, bend your back knee, and slightly shift your weight over the back leg so that you feel a gentle stretch deep in your back calf.  Hold this position for __________ seconds. Repeat __________ times. Complete this stretch __________ times per day. STRETCH - Gastrocsoleus, Standing  Note: This exercise can place a lot of stress on your foot and ankle. Please complete this exercise only if specifically instructed by your caregiver.   Place the ball of your right / left foot on a step, keeping your other foot firmly on the same step.  Hold on to the wall or a rail for balance.  Slowly lift your other foot, allowing your body weight to press your heel down over the edge of the step.  You should feel a stretch in your right / left calf.  Hold this position for __________ seconds.  Repeat this exercise with a slight bend in your right / left knee. Repeat __________ times. Complete this stretch __________ times per day.    STRENGTHENING EXERCISES - Plantar Fasciitis (Heel Spur Syndrome)  These exercises may help you when beginning to rehabilitate your injury. They may resolve your symptoms with or without further involvement from your physician, physical therapist or athletic trainer. While completing these exercises, remember:   Muscles can gain both the endurance and the strength needed for everyday activities through controlled exercises.  Complete these exercises as instructed by your physician, physical therapist or athletic trainer. Progress the resistance and repetitions only as guided. STRENGTH -  Towel Curls  Sit in a chair positioned on a non-carpeted surface.  Place your foot on a towel, keeping your heel on the floor.  Pull the towel toward your heel by only curling your toes. Keep your heel on the floor.  If instructed by your physician, physical therapist or athletic trainer, add ____________________ at the end of the towel. Repeat __________ times. Complete this exercise __________ times per day. STRENGTH - Ankle Inversion  Secure one end of a rubber exercise band/tubing to a fixed object (table, pole). Loop the other end around your foot just before your toes.  Place your fists between your knees. This will focus your strengthening at your ankle.  Slowly, pull your big toe up and in, making sure the band/tubing is positioned to resist the entire motion.  Hold this position for __________ seconds.  Have your muscles resist the band/tubing as it slowly pulls your foot back to the starting position. Repeat __________ times. Complete this exercises __________ times per day.  Document Released: 01/25/2005 Document Revised: 04/19/2011 Document Reviewed: 05/09/2008 [facility] Patient Information 2015 [place], [place]. This information is not intended to replace advice given to you by your health care provider. Make sure you discuss any questions you have with your health care provider. Heel Spur A heel spur is a hook of bone that can form on the calcaneus (the heel bone and the largest bone of the foot). Heel spurs are often associated with plantar fasciitis and usually come in people who have had the problem for an extended period of time. The cause of the relationship is unknown. The pain associated with them is thought to be caused by an inflammation (soreness and redness) of the plantar fascia rather than the spur itself. The plantar fascia is a thick fibrous like tissue that runs from the calcaneus (heel bone) to the ball of the foot. This strong, tight tissue helps maintain the  arch of your foot. It helps distribute the weight across your foot as you walk or run. Stresses placed on the plantar fascia can be tremendous. When it is inflamed normal activities become painful. Pain is worse in the morning after sleeping. After sleeping the plantar fascia is tight. The first movements stretch the fascia and this causes pain. As the tendon loosens, the pain usually gets better. It often returns with too much standing or walking.  About 70% of patients with plantar fasciitis have a heel spur. About half of people without foot pain also have heel spurs. DIAGNOSIS  The diagnosis of a heel spur is made by X-ray. The X-ray shows a hook of bone protruding from the bottom of the calcaneus at the point where the plantar fascia is attached to the heel bone.  TREATMENT  It is necessary to find out what is causing the stretching of the plantar fascia. If the cause is over-pronation (flat feet), orthotics and proper foot ware may help.  Stretching exercises, losing weight, wearing shoes that have a cushioned heel that absorbs  shock, and elevating the heel with the use of a heel cradle, heel cup, or orthotics may all help. Heel cradles and heel cups provide extra comfort and cushion to the heel, and reduce the amount of shock to the sore area. AVOIDING THE PAIN OF PLANTAR FASCIITIS AND HEEL SPURS  Consult a sports medicine professional before beginning a new exercise program.  Walking programs offer a good workout. There is a lower chance of overuse injuries common to the runners. There is less impact and less jarring of the joints.  Begin all new exercise programs slowly. If problems or pains develop, decrease the amount of time or distance until you are at a comfortable level.  Wear good shoes and replace them regularly.  Stretch your foot and the heel cords at the back of the ankle (Achilles tendons) both before and after exercise.  Run or exercise on even surfaces that are not hard.  For example, asphalt is better than pavement.  Do not run barefoot on hard surfaces.  If using a treadmill, vary the incline.  Do not continue to workout if you have foot or joint problems. Seek professional help if they do not improve. HOME CARE INSTRUCTIONS   Avoid activities that cause you pain until you recover.  Use ice or cold packs to the problem or painful areas after working out.  Only take over-the-counter or prescription medicines for pain, discomfort, or fever as directed by your caregiver.  Soft shoe inserts or athletic shoes with air or gel sole cushions may be helpful.  If problems continue or become more severe, consult a sports medicine caregiver. Cortisone is a potent anti-inflammatory medication that may be injected into the painful area. You can discuss this treatment with your caregiver. MAKE SURE YOU:   Understand these instructions.  Will watch your condition.  Will get help right away if you are not doing well or get worse. Document Released: 03/03/2005 Document Revised: 04/19/2011 Document Reviewed: 05/05/2005 [facility] Patient Information 2015 [place], [place]. This information is not intended to replace advice given to you by your health care provider. Make sure you discuss any questions you have with your health care provider.

## 2013-08-13 ENCOUNTER — Encounter: Payer: Self-pay | Admitting: Family

## 2013-08-15 ENCOUNTER — Other Ambulatory Visit: Payer: Self-pay | Admitting: *Deleted

## 2013-08-15 MED ORDER — ZOSTER VACCINE LIVE 19400 UNT/0.65ML ~~LOC~~ SOLR: 0.6500 mL | Freq: Once | SUBCUTANEOUS | Status: DC

## 2013-08-15 NOTE — Telephone Encounter (Signed)
Prescription for Zostavax written. At front desk

## 2013-09-04 ENCOUNTER — Other Ambulatory Visit: Payer: Self-pay | Admitting: Family

## 2013-09-07 ENCOUNTER — Ambulatory Visit (INDEPENDENT_AMBULATORY_CARE_PROVIDER_SITE_OTHER): Payer: BC Managed Care – PPO | Admitting: Family

## 2013-09-07 ENCOUNTER — Encounter: Payer: Self-pay | Admitting: Family

## 2013-09-07 VITALS — BP 133/80 | HR 85 | Temp 98.0°F | Ht 65.0 in | Wt 193.0 lb

## 2013-09-07 DIAGNOSIS — M7730 Calcaneal spur, unspecified foot: Secondary | ICD-10-CM

## 2013-09-07 DIAGNOSIS — G609 Hereditary and idiopathic neuropathy, unspecified: Secondary | ICD-10-CM

## 2013-09-07 DIAGNOSIS — M7731 Calcaneal spur, right foot: Secondary | ICD-10-CM

## 2013-09-07 DIAGNOSIS — G629 Polyneuropathy, unspecified: Secondary | ICD-10-CM

## 2013-09-07 MED ORDER — GABAPENTIN 300 MG PO CAPS: 300.0000 mg | ORAL_CAPSULE | Freq: Three times a day (TID) | ORAL | Status: DC

## 2013-09-07 NOTE — Patient Instructions (Signed)
Heel Spur A heel spur is a hook of bone that can form on the calcaneus (the heel bone and the largest bone of the foot). Heel spurs are often associated with plantar fasciitis and usually come in people who have had the problem for an extended period of time. The cause of the relationship is unknown. The pain associated with them is thought to be caused by an inflammation (soreness and redness) of the plantar fascia rather than the spur itself. The plantar fascia is a thick fibrous like tissue that runs from the calcaneus (heel bone) to the ball of the foot. This strong, tight tissue helps maintain the arch of your foot. It helps distribute the weight across your foot as you walk or run. Stresses placed on the plantar fascia can be tremendous. When it is inflamed normal activities become painful. Pain is worse in the morning after sleeping. After sleeping the plantar fascia is tight. The first movements stretch the fascia and this causes pain. As the tendon loosens, the pain usually gets better. It often returns with too much standing or walking.  About 70% of patients with plantar fasciitis have a heel spur. About half of people without foot pain also have heel spurs. DIAGNOSIS  The diagnosis of a heel spur is made by X-ray. The X-ray shows a hook of bone protruding from the bottom of the calcaneus at the point where the plantar fascia is attached to the heel bone.  TREATMENT  It is necessary to find out what is causing the stretching of the plantar fascia. If the cause is over-pronation (flat feet), orthotics and proper foot ware may help.  Stretching exercises, losing weight, wearing shoes that have a cushioned heel that absorbs shock, and elevating the heel with the use of a heel cradle, heel cup, or orthotics may all help. Heel cradles and heel cups provide extra comfort and cushion to the heel, and reduce the amount of shock to the sore area. AVOIDING THE PAIN OF PLANTAR FASCIITIS AND HEEL  SPURS  Consult a sports medicine professional before beginning a new exercise program.  Walking programs offer a good workout. There is a lower chance of overuse injuries common to the runners. There is less impact and less jarring of the joints.  Begin all new exercise programs slowly. If problems or pains develop, decrease the amount of time or distance until you are at a comfortable level.  Wear good shoes and replace them regularly.  Stretch your foot and the heel cords at the back of the ankle (Achilles tendons) both before and after exercise.  Run or exercise on even surfaces that are not hard. For example, asphalt is better than pavement.  Do not run barefoot on hard surfaces.  If using a treadmill, vary the incline.  Do not continue to workout if you have foot or joint problems. Seek professional help if they do not improve. HOME CARE INSTRUCTIONS   Avoid activities that cause you pain until you recover.  Use ice or cold packs to the problem or painful areas after working out.  Only take over-the-counter or prescription medicines for pain, discomfort, or fever as directed by your caregiver.  Soft shoe inserts or athletic shoes with air or gel sole cushions may be helpful.  If problems continue or become more severe, consult a sports medicine caregiver. Cortisone is a potent anti-inflammatory medication that may be injected into the painful area. You can discuss this treatment with your caregiver. MAKE SURE YOU:  Understand these instructions.  Will watch your condition.  Will get help right away if you are not doing well or get worse. Document Released: 03/03/2005 Document Revised: 04/19/2011 Document Reviewed: 03/28/2013 [employer] Patient Information 2015 [employer]. This information is not intended to replace advice given to you by your health care provider. Make sure you discuss any questions you have with your health care provider.  

## 2013-09-07 NOTE — Progress Notes (Signed)
   Subjective:    Patient ID: Kristen Becker, female    DOB: 1950/09/16, 63 y.o.   MRN: [id number]  Foot Pain Associated symptoms include joint swelling. Pertinent negatives include no headaches.   Pt presents to the office for follow-up for chronic back pain, right heel pain. Pt was started on Mobic and flexeril. Pt states her back pain has improved, but she is having bilateral "foot burning" and pain. States she is having right foot pain and she has tired resting. Pt states the pain is a constant achy and burning 4 out 10.    Review of Systems  Constitutional: Negative.   HENT: Negative.   Eyes: Negative.   Respiratory: Negative.  Negative for shortness of breath.   Cardiovascular: Negative.  Negative for palpitations.  Gastrointestinal: Negative.   Endocrine: Negative.   Genitourinary: Negative.   Musculoskeletal: Positive for back pain, gait problem and joint swelling.  Neurological: Negative.  Negative for headaches.  Hematological: Negative.   Psychiatric/Behavioral: Negative.   All other systems reviewed and are negative.      Objective:   Physical Exam  Vitals reviewed. Constitutional: She is oriented to person, place, and time. She appears well-developed and well-nourished. No distress.  Cardiovascular: Normal rate, regular rhythm, normal heart sounds and intact distal pulses.   No murmur heard. Pulmonary/Chest: Effort normal and breath sounds normal. No respiratory distress. She has no wheezes.  Abdominal: Soft. Bowel sounds are normal. She exhibits no distension. There is no tenderness.  Musculoskeletal: Normal range of motion. She exhibits edema (trace amt of edema in right ankle) and tenderness (mild tenderness in right heel and back of leg).  Neurological: She is alert and oriented to person, place, and time. She has normal reflexes. No cranial nerve deficit.  Skin: Skin is warm and dry.  Psychiatric: She has a normal mood and affect. Her behavior is normal. Judgment  and thought content normal.     BP 133/80  Pulse 85  Temp(Src) 98 F (36.7 C) (Oral)  Ht 5\' 5"  (1.651 m)  Wt 193 lb (87.544 kg)  BMI 32.12 kg/m2      Assessment & Plan:  1. Heel spur, right - Ambulatory referral to Orthopedic Surgery  2. Peripheral neuropathy - gabapentin (NEURONTIN) 300 MG capsule; Take 1 capsule (300 mg total) by mouth 3 (three) times daily.  Dispense: 90 capsule; Refill: 3  RTO prn Pt educated on importance of "good shoes" Stretches discussed Rest  Kristen Dun, FNP

## 2013-10-25 ENCOUNTER — Other Ambulatory Visit: Payer: Self-pay | Admitting: *Deleted

## 2013-10-25 DIAGNOSIS — G629 Polyneuropathy, unspecified: Secondary | ICD-10-CM

## 2013-10-25 MED ORDER — GABAPENTIN 300 MG PO CAPS: 300.0000 mg | ORAL_CAPSULE | Freq: Three times a day (TID) | ORAL | Status: DC

## 2013-11-23 ENCOUNTER — Other Ambulatory Visit: Payer: Self-pay

## 2013-12-05 ENCOUNTER — Ambulatory Visit (INDEPENDENT_AMBULATORY_CARE_PROVIDER_SITE_OTHER): Payer: 59

## 2013-12-05 DIAGNOSIS — Z23 Encounter for immunization: Secondary | ICD-10-CM

## 2014-02-01 ENCOUNTER — Other Ambulatory Visit: Payer: Self-pay | Admitting: Family

## 2014-02-04 ENCOUNTER — Other Ambulatory Visit: Payer: Self-pay | Admitting: Family

## 2014-11-26 LAB — HM MAMMOGRAPHY

## 2014-11-27 LAB — HM PAP SMEAR

## 2015-04-25 ENCOUNTER — Ambulatory Visit (INDEPENDENT_AMBULATORY_CARE_PROVIDER_SITE_OTHER): Payer: Medicare Other | Admitting: Family Medicine

## 2015-04-25 ENCOUNTER — Encounter: Payer: Self-pay | Admitting: Family Medicine

## 2015-04-25 VITALS — BP 133/76 | HR 67 | Temp 97.4°F | Ht 65.0 in | Wt 196.6 lb

## 2015-04-25 DIAGNOSIS — E669 Obesity, unspecified: Secondary | ICD-10-CM

## 2015-04-25 DIAGNOSIS — Z0000 Encounter for general adult medical examination without abnormal findings: Secondary | ICD-10-CM

## 2015-04-25 DIAGNOSIS — M5432 Sciatica, left side: Secondary | ICD-10-CM

## 2015-04-25 DIAGNOSIS — M5430 Sciatica, unspecified side: Secondary | ICD-10-CM | POA: Insufficient documentation

## 2015-04-25 NOTE — Progress Notes (Signed)
   HPI  Patient presents today here today to discuss back and foot pain.  Back and foot pain have been long-standing for more than one year. She states she's tried gabapentin which caused a rash and foggy feeling She's tried ibuprofen with mild improvement She's tried topical ointments with no improvement She describes low back pain that radiates down her left heel, this is described as pins and needle type pain. Hurts worse with moving across bedsheets, walking, or bending. She has a history of vertebral compression fracture which she feels may contribute into this. In seeing orthopedic surgery which she did not feel was helpful. She like a referral to [facility]  Obesity Watching her diet closely, unable to exercise due to back pain.  Healthcare maintenance She's had a recent Pap smear and mammogram by Dr. Shaune Pollack at [facility] in [place] She is due for colonoscopy and will call her GI doctor.   PMH: Smoking status noted ROS: Per HPI  Objective: BP 133/76 mmHg  Pulse 67  Temp(Src) 97.4 F (36.3 C) (Oral)  Ht '5\' 5"'$  (1.651 m)  Wt 196 lb 9.6 oz (89.177 kg)  BMI 32.72 kg/m2 Gen: NAD, alert, cooperative with exam HEENT: NCAT, EOMI, PERRL CV: RRR, good S1/S2, no murmur Resp: CTABL, no wheezes, non-labored Abd: SNTND, BS present, no guarding or organomegaly Ext: No edema, warm Neuro: Alert and oriented, strength 5/5 and sensation intact in bilateral lower extremities, 2+ patellar tendon reflexes bilaterally  Assessment and plan:  # Back and heel pain, sciatica Most consistent with sciatica Referring orthopedic surgery for her request Offered Lyrica, she declines  # Obesity Discussed diet and exercise, she is doing very well with this I hope that improving her back pain will help this as well Labs  # Healthcare maintenance Hepatitis C chest Pap and mammogram up-to-date Colonoscopy due and she will call her GI doctor to  arrange     Orders Placed This Encounter  Procedures  . Lipid panel  . CMP14+EGFR  . CBC with Differential  . Hepatitis C antibody    Meds ordered this encounter  Medications  . trimethoprim (TRIMPEX) 100 MG tablet    Sig: Take 100 mg by mouth 2 (two) times daily.  Marland Kitchen estradiol (ESTRACE) 0.1 MG/GM vaginal cream    Sig: Place 1 Applicatorful vaginally at bedtime.  . Methenamine-Sodium Salicylate (CYSTEX PO)    Sig: Take by mouth.    Laroy Apple, MD [facility] 04/25/2015, 11:10 AM

## 2015-04-25 NOTE — Patient Instructions (Signed)
Great to meet you!  We will call [facility] at the number you gave [place] for an appointment  Lets see you back in 1 year unless you need [place] sooner.

## 2015-04-26 LAB — CMP14+EGFR
ALBUMIN: 4.6 g/dL (ref 3.6–4.8)
ALK PHOS: 67 IU/L (ref 39–117)
ALT: 23 IU/L (ref 0–32)
AST: 17 IU/L (ref 0–40)
Albumin/Globulin Ratio: 2.3 — ABNORMAL HIGH (ref 1.2–2.2)
BILIRUBIN TOTAL: 0.5 mg/dL (ref 0.0–1.2)
BUN / CREAT RATIO: 14 (ref 11–26)
BUN: 12 mg/dL (ref 8–27)
CHLORIDE: 104 mmol/L (ref 96–106)
CO2: 23 mmol/L (ref 18–29)
CREATININE: 0.84 mg/dL (ref 0.57–1.00)
Calcium: 9.4 mg/dL (ref 8.7–10.3)
GFR calc Af Amer: 84 mL/min/{1.73_m2} (ref >59)
GFR calc non Af Amer: 73 mL/min/{1.73_m2} (ref >59)
GLUCOSE: 87 mg/dL (ref 65–99)
Globulin, Total: 2 g/dL (ref 1.5–4.5)
Potassium: 4.1 mmol/L (ref 3.5–5.2)
SODIUM: 143 mmol/L (ref 134–144)
Total Protein: 6.6 g/dL (ref 6.0–8.5)

## 2015-04-26 LAB — CBC WITH DIFFERENTIAL/PLATELET
BASOS ABS: 0 10*3/uL (ref 0.0–0.2)
Basos: 1 %
EOS (ABSOLUTE): 0.1 10*3/uL (ref 0.0–0.4)
Eos: 2 %
Hematocrit: 41.7 % (ref 34.0–46.6)
Hemoglobin: 13.9 g/dL (ref 11.1–15.9)
IMMATURE GRANULOCYTES: 0 %
Immature Grans (Abs): 0 10*3/uL (ref 0.0–0.1)
LYMPHS ABS: 1.8 10*3/uL (ref 0.7–3.1)
Lymphs: 36 %
MCH: 30.5 pg (ref 26.6–33.0)
MCHC: 33.3 g/dL (ref 31.5–35.7)
MCV: 91 fL (ref 79–97)
MONOCYTES: 8 %
MONOS ABS: 0.4 10*3/uL (ref 0.1–0.9)
NEUTROS PCT: 53 %
Neutrophils Absolute: 2.7 10*3/uL (ref 1.4–7.0)
Platelets: 175 10*3/uL (ref 150–379)
RBC: 4.56 x10E6/uL (ref 3.77–5.28)
RDW: 13.4 % (ref 12.3–15.4)
WBC: 5 10*3/uL (ref 3.4–10.8)

## 2015-04-26 LAB — HEPATITIS C ANTIBODY: HEP C VIRUS AB: 0.1 {s_co_ratio} (ref 0.0–0.9)

## 2015-04-26 LAB — LIPID PANEL
CHOL/HDL RATIO: 2.5 ratio (ref 0.0–4.4)
Cholesterol, Total: 196 mg/dL (ref 100–199)
HDL: 79 mg/dL (ref >39)
LDL CALC: 103 mg/dL — AB (ref 0–99)
Triglycerides: 70 mg/dL (ref 0–149)
VLDL CHOLESTEROL CAL: 14 mg/dL (ref 5–40)

## 2015-05-01 ENCOUNTER — Encounter: Payer: Self-pay | Admitting: *Deleted

## 2015-05-05 DIAGNOSIS — M5117 Intervertebral disc disorders with radiculopathy, lumbosacral region: Secondary | ICD-10-CM | POA: Diagnosis not present

## 2015-05-05 DIAGNOSIS — M5417 Radiculopathy, lumbosacral region: Secondary | ICD-10-CM | POA: Diagnosis not present

## 2015-05-06 ENCOUNTER — Telehealth: Payer: Self-pay

## 2015-05-06 NOTE — Telephone Encounter (Signed)
Need C Pap supplies but I now have Medicare and [employer] so I don't know how to go about getting these supplies

## 2015-05-16 DIAGNOSIS — M47896 Other spondylosis, lumbar region: Secondary | ICD-10-CM | POA: Diagnosis not present

## 2015-05-16 DIAGNOSIS — M5417 Radiculopathy, lumbosacral region: Secondary | ICD-10-CM | POA: Diagnosis not present

## 2015-05-28 DIAGNOSIS — M4698 Unspecified inflammatory spondylopathy, sacral and sacrococcygeal region: Secondary | ICD-10-CM | POA: Diagnosis not present

## 2015-05-28 DIAGNOSIS — M5432 Sciatica, left side: Secondary | ICD-10-CM | POA: Diagnosis not present

## 2015-06-03 NOTE — Telephone Encounter (Signed)
After leaving several messages with no return call, I am closing this until further contact.

## 2015-06-11 DIAGNOSIS — M533 Sacrococcygeal disorders, not elsewhere classified: Secondary | ICD-10-CM | POA: Diagnosis not present

## 2015-06-25 DIAGNOSIS — M545 Low back pain: Secondary | ICD-10-CM | POA: Diagnosis not present

## 2015-06-25 DIAGNOSIS — M79605 Pain in left leg: Secondary | ICD-10-CM | POA: Diagnosis not present

## 2015-06-25 DIAGNOSIS — M7662 Achilles tendinitis, left leg: Secondary | ICD-10-CM | POA: Diagnosis not present

## 2015-06-25 DIAGNOSIS — G8929 Other chronic pain: Secondary | ICD-10-CM | POA: Diagnosis not present

## 2015-08-21 DIAGNOSIS — M533 Sacrococcygeal disorders, not elsewhere classified: Secondary | ICD-10-CM | POA: Diagnosis not present

## 2015-09-22 DIAGNOSIS — M7662 Achilles tendinitis, left leg: Secondary | ICD-10-CM | POA: Diagnosis not present

## 2015-09-22 DIAGNOSIS — M545 Low back pain: Secondary | ICD-10-CM | POA: Diagnosis not present

## 2015-09-22 DIAGNOSIS — M7732 Calcaneal spur, left foot: Secondary | ICD-10-CM | POA: Diagnosis not present

## 2015-09-22 DIAGNOSIS — M7731 Calcaneal spur, right foot: Secondary | ICD-10-CM | POA: Diagnosis not present

## 2015-09-22 DIAGNOSIS — G8929 Other chronic pain: Secondary | ICD-10-CM | POA: Diagnosis not present

## 2015-09-22 DIAGNOSIS — M79672 Pain in left foot: Secondary | ICD-10-CM | POA: Diagnosis not present

## 2015-09-22 DIAGNOSIS — M79671 Pain in right foot: Secondary | ICD-10-CM | POA: Diagnosis not present

## 2015-09-25 ENCOUNTER — Ambulatory Visit (INDEPENDENT_AMBULATORY_CARE_PROVIDER_SITE_OTHER): Payer: Medicare Other | Admitting: Family Medicine

## 2015-09-25 ENCOUNTER — Encounter: Payer: Self-pay | Admitting: Family Medicine

## 2015-09-25 VITALS — BP 137/74 | HR 76 | Temp 97.0°F

## 2015-09-25 DIAGNOSIS — Z23 Encounter for immunization: Secondary | ICD-10-CM

## 2015-09-25 DIAGNOSIS — S0191XA Laceration without foreign body of unspecified part of head, initial encounter: Secondary | ICD-10-CM | POA: Diagnosis not present

## 2015-09-25 NOTE — Progress Notes (Signed)
BP 137/74 (BP Location: Left Arm, Patient Position: Sitting, Cuff Size: Large)   Pulse 76   Temp 97 F (36.1 C)    Subjective:    Patient ID: [name], female    DOB: 1950-02-11, 65 y.o.   MRN: [id number]  HPI: Kristen Becker is a 65 y.o. female presenting on 09/25/2015 for Laceration (to back of right ear fell onto a heavy duty trashcan while cleaning it )   HPI Laceration behind right ear Patient fell while washing out the trash can earlier today and somehow landed on the right side of her head on the trash can and cut her head. It was bleeding and she had her husband look at it and then they brought her here. She had some lightheadedness initially with the initial shock of it but has not had any since.  Relevant past medical, surgical, family and social history reviewed and updated as indicated. Interim medical history since our last visit reviewed. Allergies and medications reviewed and updated.  Review of Systems  Constitutional: Negative for chills and fever.  Respiratory: Negative for chest tightness and shortness of breath.   Cardiovascular: Negative for chest pain and leg swelling.  Skin: Positive for wound. Negative for color change and rash.  Neurological: Positive for headaches. Negative for dizziness.   Per HPI unless specifically indicated above    Medication List       Accurate as of 09/25/15  3:21 PM. Always use your most recent med list.          acetaminophen 500 MG tablet Commonly known as:  TYLENOL Take by mouth.   Astaxanthin 4 MG Caps Take by mouth.   CYSTEX PO Take by mouth.   estradiol 0.1 MG/GM vaginal cream Commonly known as:  ESTRACE Place 1 Applicatorful vaginally at bedtime.   Magnesium 500 MG Tabs Take 500 mg by mouth 2 (two) times daily.   Omega-3 1000 MG Caps Take 2 g by mouth.   RA VITAMIN B-12 TR 1000 MCG Tbcr Generic drug:  Cyanocobalamin Take by mouth.   trimethoprim 100 MG tablet Commonly known as:  TRIMPEX Take  100 mg by mouth 2 (two) times daily.   UNABLE TO FIND Take by mouth.   zinc gluconate 50 MG tablet Take 50 mg by mouth daily.          Objective:    BP 137/74 (BP Location: Left Arm, Patient Position: Sitting, Cuff Size: Large)   Pulse 76   Temp 97 F (36.1 C)   Wt Readings from Last 3 Encounters:  04/25/15 196 lb 9.6 oz (89.2 kg)  09/07/13 193 lb (87.5 kg)  08/08/13 191 lb 9.6 oz (86.9 kg)    Physical Exam  Constitutional: She is oriented to person, place, and time. She appears well-developed and well-nourished. No distress.  Eyes: Conjunctivae and EOM are normal. Pupils are equal, round, and reactive to light.  Cardiovascular: Normal rate, regular rhythm, normal heart sounds and intact distal pulses.   No murmur heard. Pulmonary/Chest: Effort normal and breath sounds normal. No respiratory distress. She has no wheezes.  Musculoskeletal: Normal range of motion. She exhibits no edema or tenderness.  Neurological: She is alert and oriented to person, place, and time. Coordination normal.  Skin: Skin is warm and dry. Laceration (3cm laceration behind right ear) noted. No rash noted. She is not diaphoretic.  Psychiatric: She has a normal mood and affect. Her behavior is normal.  Nursing note and vitals reviewed.  Laceration repair: Wound  was irrigated with normal saline at pressure. 2% lidocaine with epinephrine was used for local anesthesia, 24mL. 4-0 Ethilon was used to repair the wound.  Placed 3 sutures. Wound was approximated well and topical antibiotic was used and then it was covered by 4 x 4 and tape told in place. Procedure was tolerated well    Assessment & Plan:   Problem List Items Addressed This Visit    None    Visit Diagnoses    Laceration of head, initial encounter    -  Primary   3.5 cm laceration just behind her right ear. Placed 3 sutures, come back in 10 days      Follow up plan: Return in about 10 days (around 10/05/2015), or if symptoms worsen or fail  to improve, for Suture removal.  Counseling provided for all of the vaccine components Orders Placed This Encounter  Procedures  . Tdap vaccine greater than or equal to 7yo IM    Caryl Pina, MD [facility] 09/25/2015, 3:21 PM

## 2015-09-25 NOTE — Patient Instructions (Signed)

## 2015-10-06 ENCOUNTER — Ambulatory Visit (INDEPENDENT_AMBULATORY_CARE_PROVIDER_SITE_OTHER): Payer: Medicare Other | Admitting: Family Medicine

## 2015-10-06 ENCOUNTER — Ambulatory Visit: Payer: Medicare Other | Admitting: Family Medicine

## 2015-10-06 ENCOUNTER — Encounter: Payer: Self-pay | Admitting: Family Medicine

## 2015-10-06 VITALS — BP 113/70 | HR 73 | Temp 97.8°F | Ht 65.0 in | Wt 197.0 lb

## 2015-10-06 DIAGNOSIS — S0191XD Laceration without foreign body of unspecified part of head, subsequent encounter: Secondary | ICD-10-CM

## 2015-10-06 NOTE — Progress Notes (Signed)
Patient comes in today for suture removal on the laceration behind her right ear. She has some tightness there but has not had any drainage or erythema or warmth.  3 sutures removed without issue. Wound appears to be healing well.  Recommended for patient to use topical lotions to reduce scarring that contain vitamin E.  Problem List Items Addressed This Visit    None    Visit Diagnoses    Laceration of head, subsequent encounter    -  Primary   Removed 3 lacerations without any issues.

## 2015-11-04 DIAGNOSIS — N390 Urinary tract infection, site not specified: Secondary | ICD-10-CM | POA: Diagnosis not present

## 2015-11-07 DIAGNOSIS — L820 Inflamed seborrheic keratosis: Secondary | ICD-10-CM | POA: Diagnosis not present

## 2015-11-07 DIAGNOSIS — L579 Skin changes due to chronic exposure to nonionizing radiation, unspecified: Secondary | ICD-10-CM | POA: Diagnosis not present

## 2015-11-07 DIAGNOSIS — L821 Other seborrheic keratosis: Secondary | ICD-10-CM | POA: Diagnosis not present

## 2015-11-07 DIAGNOSIS — L814 Other melanin hyperpigmentation: Secondary | ICD-10-CM | POA: Diagnosis not present

## 2015-11-07 DIAGNOSIS — B001 Herpesviral vesicular dermatitis: Secondary | ICD-10-CM | POA: Diagnosis not present

## 2015-11-28 IMAGING — CR DG CERVICAL SPINE COMPLETE 4+V
5 series · 5 of 5 positions shown · non-contrast
Comparison: None.

CLINICAL DATA: Neck pain

EXAM:
CERVICAL SPINE  4+ VIEWS

[view not recorded (1 of 5)]
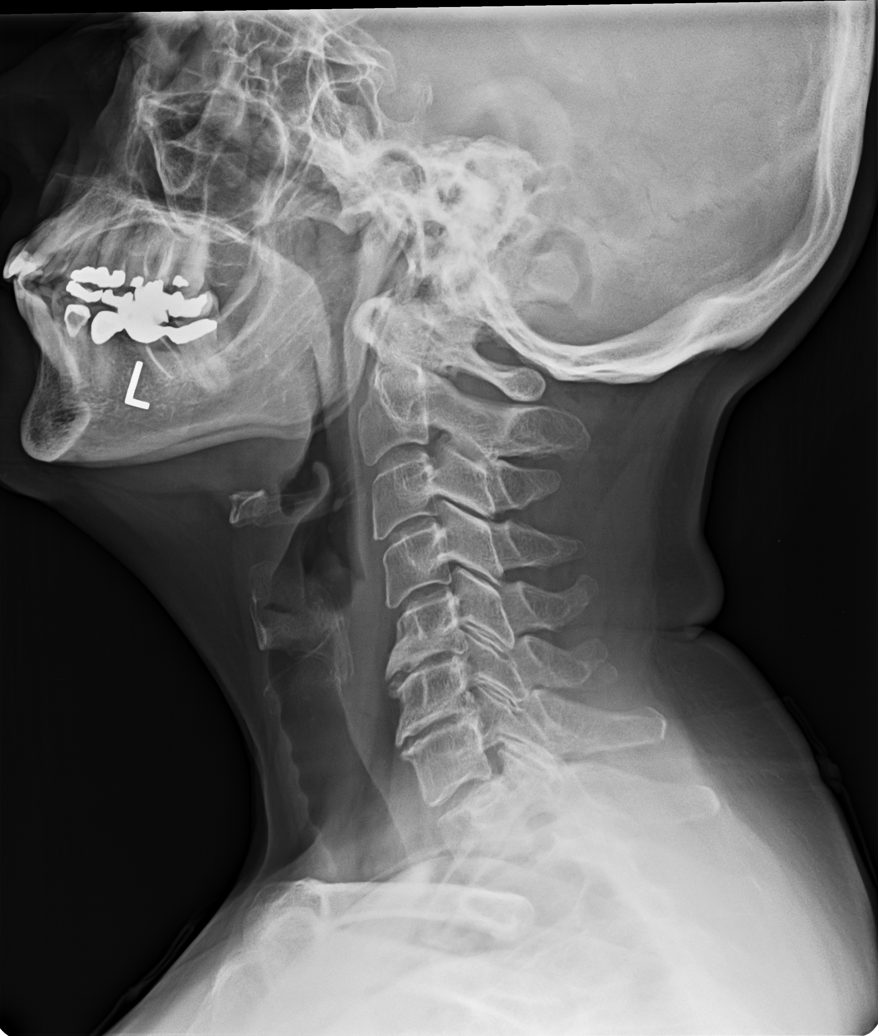

[view not recorded (2 of 5)]
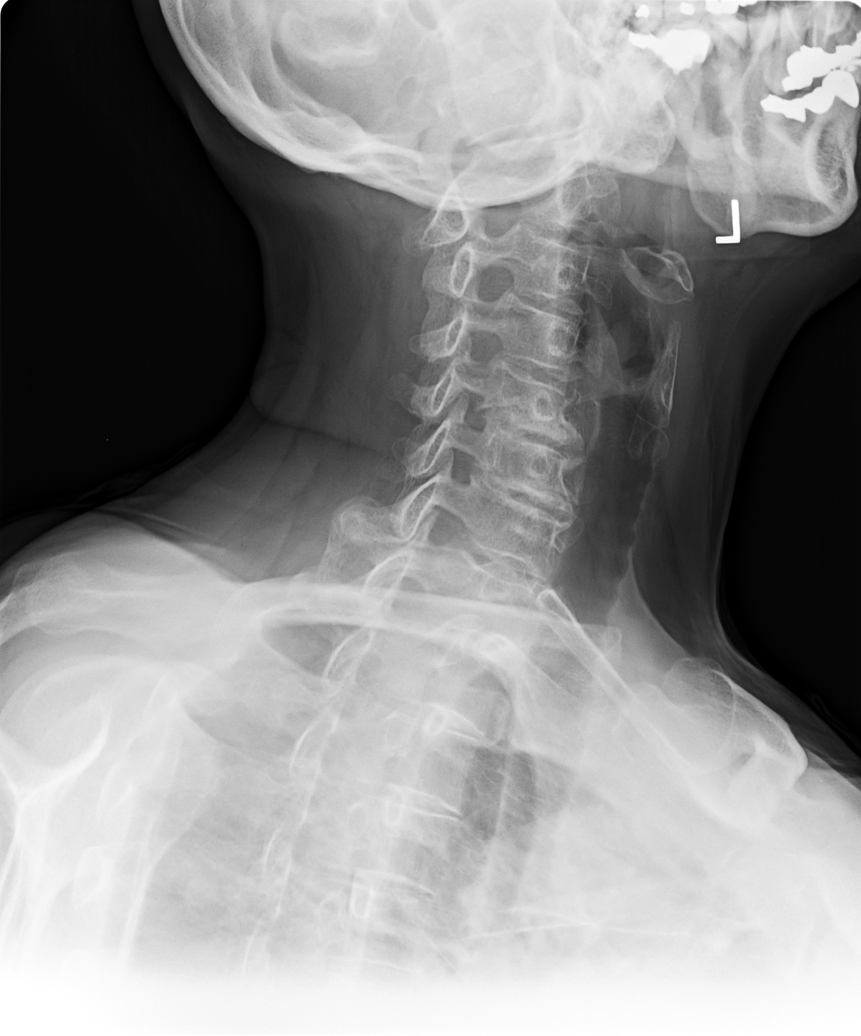

[view not recorded (3 of 5)]
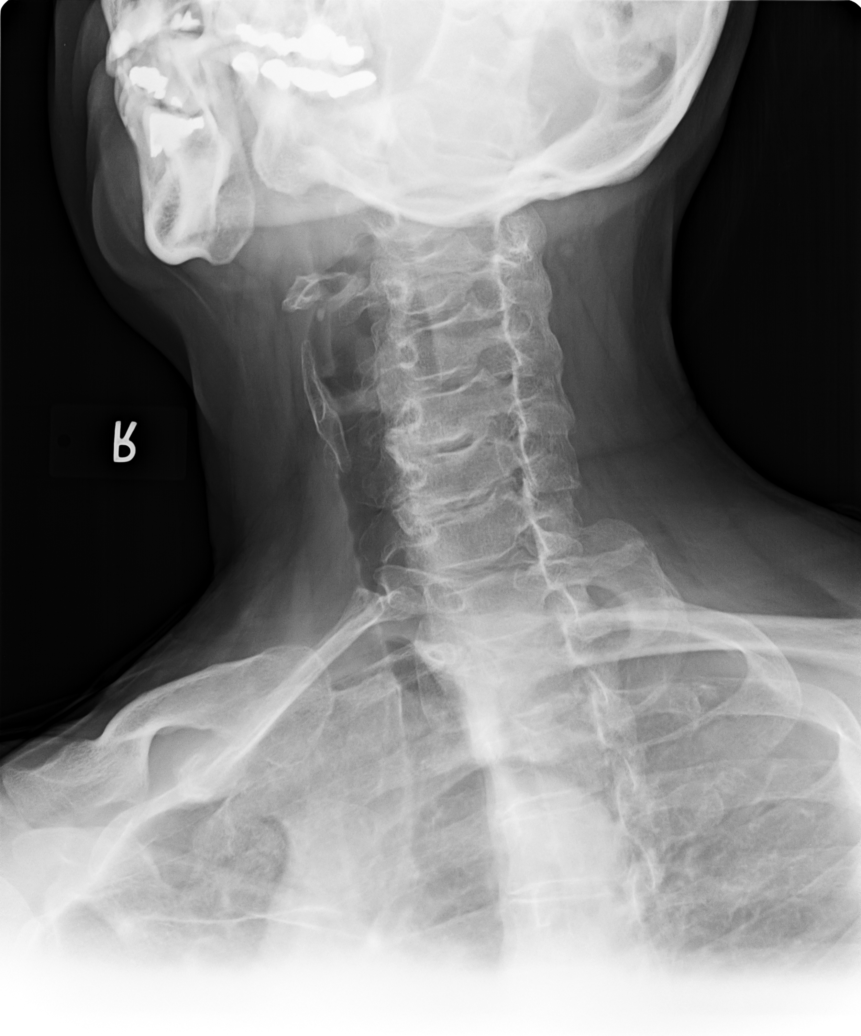

[view not recorded (4 of 5)]
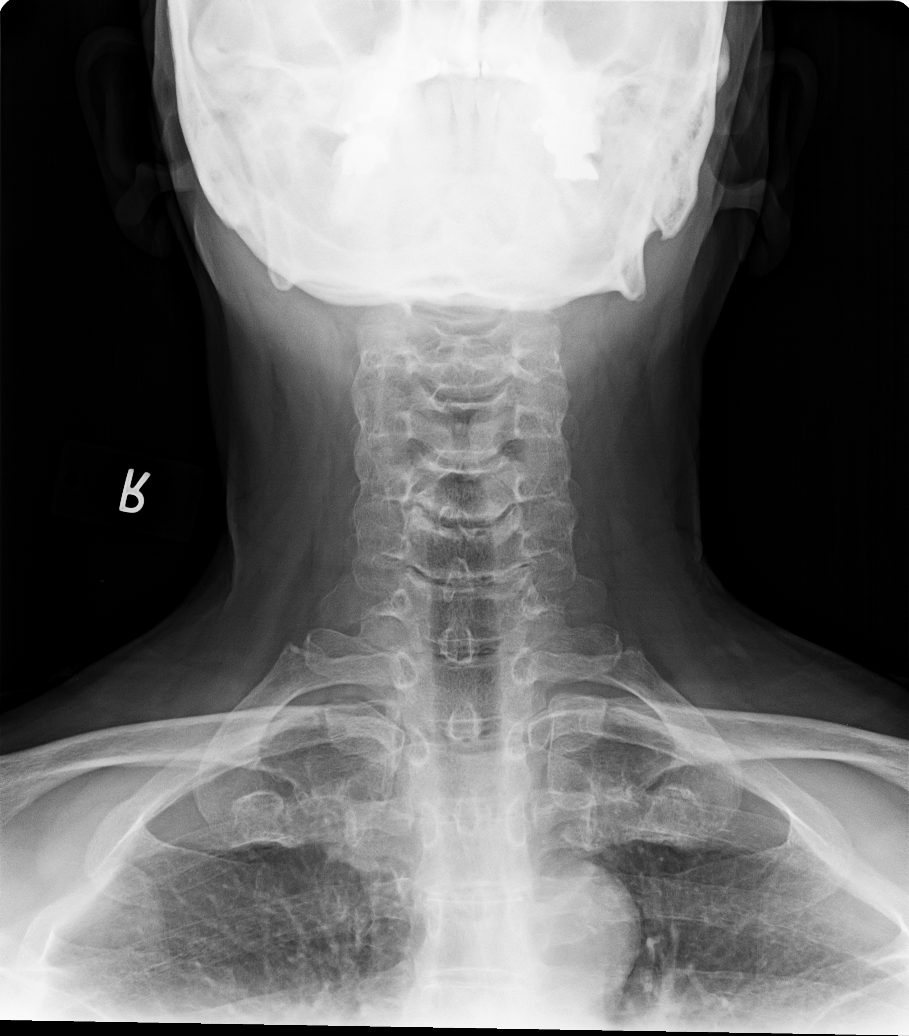

[view not recorded (5 of 5)]
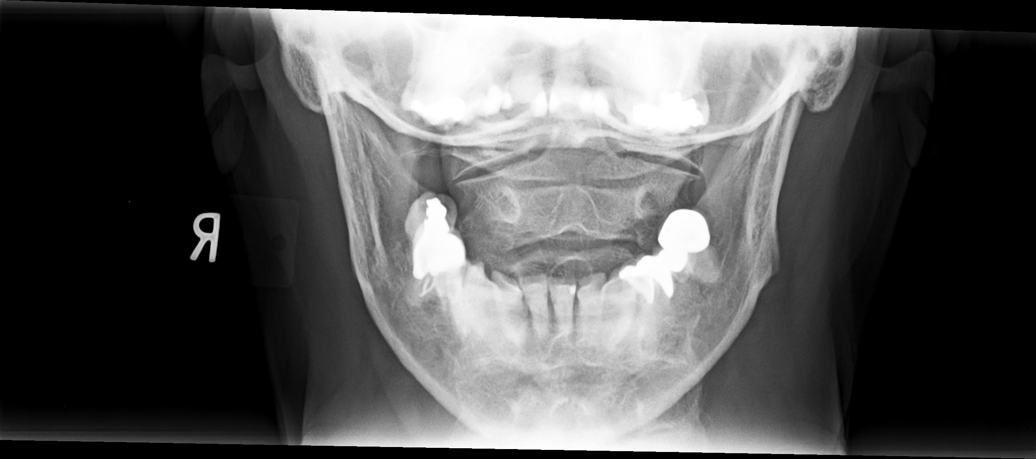

[5 of 5 positions shown; findings below may reference images not displayed]

FINDINGS: The cervical spine is visualized to the level of T1.

The vertebral body heights are maintained. The alignment is normal.
The prevertebral soft tissues are normal. There is no acute fracture
or static listhesis. There is degenerative disc disease at C5-6 and
C6-7 with disc space narrowing and discogenic endplate osteophytes.
IMPRESSION: Degenerative disc disease at C5-6 and C6-7.

## 2016-01-14 ENCOUNTER — Ambulatory Visit (INDEPENDENT_AMBULATORY_CARE_PROVIDER_SITE_OTHER): Payer: Medicare Other | Admitting: *Deleted

## 2016-01-14 DIAGNOSIS — Z23 Encounter for immunization: Secondary | ICD-10-CM | POA: Diagnosis not present

## 2016-01-21 DIAGNOSIS — Z1231 Encounter for screening mammogram for malignant neoplasm of breast: Secondary | ICD-10-CM | POA: Diagnosis not present

## 2016-01-21 DIAGNOSIS — N952 Postmenopausal atrophic vaginitis: Secondary | ICD-10-CM | POA: Diagnosis not present

## 2016-01-21 DIAGNOSIS — Z01419 Encounter for gynecological examination (general) (routine) without abnormal findings: Secondary | ICD-10-CM | POA: Diagnosis not present

## 2016-02-11 ENCOUNTER — Encounter: Payer: Self-pay | Admitting: Family Medicine

## 2016-02-11 ENCOUNTER — Ambulatory Visit (INDEPENDENT_AMBULATORY_CARE_PROVIDER_SITE_OTHER): Payer: Medicare Other | Admitting: Family Medicine

## 2016-02-11 VITALS — BP 128/72 | HR 70 | Temp 98.1°F | Ht 65.0 in | Wt 204.0 lb

## 2016-02-11 DIAGNOSIS — J40 Bronchitis, not specified as acute or chronic: Secondary | ICD-10-CM

## 2016-02-11 DIAGNOSIS — J329 Chronic sinusitis, unspecified: Secondary | ICD-10-CM | POA: Diagnosis not present

## 2016-02-11 MED ORDER — AMOXICILLIN-POT CLAVULANATE 875-125 MG PO TABS: 1.0000 | ORAL_TABLET | Freq: Two times a day (BID) | ORAL | 0 refills | Status: DC

## 2016-02-11 MED ORDER — PSEUDOEPHEDRINE-GUAIFENESIN ER 120-1200 MG PO TB12: 1.0000 | ORAL_TABLET | Freq: Two times a day (BID) | ORAL | 0 refills | Status: DC

## 2016-02-11 MED ORDER — HYDROCODONE-HOMATROPINE 5-1.5 MG/5ML PO SYRP: 5.0000 mL | ORAL_SOLUTION | Freq: Four times a day (QID) | ORAL | 0 refills | Status: DC | PRN

## 2016-02-11 NOTE — Progress Notes (Signed)
Subjective:  Patient ID: Conor Camhi, female    DOB: Feb 25, 1950  Age: 66 y.o. MRN: [id number]  CC: Cough (congestion - started saturday)   HPI Doyne Breceda presents for Patient presents with upper respiratory congestion. Rhinorrhea that is frequently purulent. There is moderate sore throat. Patient reports Profuse cough as well. Yellow & green colored/purulent sputum noted. There is no fever no chills no sweats. The patient denies being short of breath. Onset was 5 days ago. Gradually worsening in spite of home remedies including nasal rinse, guaifenisin, sudafed, etc.    History Moksha has a past medical history of Lumbar vertebral fracture ([place]); Menopause; Neuropathy ([place]); Sleep apnea; and TMJ (temporomandibular joint syndrome).   She has no past surgical history on file.   Her family history includes Arthritis in her brother; Cancer in her father; Heart disease in her brother, father, and mother; Hypertension in her father; Neuropathy in her brother, brother, brother, brother, and brother.She reports that she has never smoked. She has never used smokeless tobacco. She reports that she does not drink alcohol or use drugs.    ROS Review of Systems  Constitutional: Negative for activity change, appetite change, chills and fever.  HENT: Positive for congestion, postnasal drip, rhinorrhea and sinus pressure. Negative for ear discharge, ear pain, hearing loss, nosebleeds, sneezing and trouble swallowing.   Respiratory: Positive for cough and shortness of breath. Negative for chest tightness.   Cardiovascular: Negative for chest pain and palpitations.  Skin: Negative for rash.    Objective:  BP 128/72 (BP Location: Left Arm)   Pulse 70   Temp 98.1 F (36.7 C) (Oral)   Ht 5\' 5"  (1.651 m)   Wt 204 lb (92.5 kg)   BMI 33.95 kg/m   BP Readings from Last 3 Encounters:  02/11/16 128/72  10/06/15 113/70  09/25/15 137/74    Wt Readings from Last 3 Encounters:  02/11/16 204  lb (92.5 kg)  10/06/15 197 lb (89.4 kg)  04/25/15 196 lb 9.6 oz (89.2 kg)     Physical Exam  Constitutional: She appears well-developed and well-nourished.  HENT:  Head: Normocephalic and atraumatic.  Right Ear: Tympanic membrane and external ear normal. No decreased hearing is noted.  Left Ear: Tympanic membrane and external ear normal. No decreased hearing is noted.  Nose: Mucosal edema present. Right sinus exhibits no frontal sinus tenderness. Left sinus exhibits no frontal sinus tenderness.  Mouth/Throat: No oropharyngeal exudate or posterior oropharyngeal erythema.  Neck: No Brudzinski's sign noted.  Pulmonary/Chest: Breath sounds normal. No respiratory distress.  Lymphadenopathy:       Head (right side): No preauricular adenopathy present.       Head (left side): No preauricular adenopathy present.       Right cervical: No superficial cervical adenopathy present.      Left cervical: No superficial cervical adenopathy present.     Lab Results  Component Value Date   WBC 5.0 04/25/2015   HCT 41.7 04/25/2015   PLT 175 04/25/2015   GLUCOSE 87 04/25/2015   CHOL 196 04/25/2015   TRIG 70 04/25/2015   HDL 79 04/25/2015   LDLCALC 103 (H) 04/25/2015   ALT 23 04/25/2015   AST 17 04/25/2015   NA 143 04/25/2015   K 4.1 04/25/2015   CL 104 04/25/2015   CREATININE 0.84 04/25/2015   BUN 12 04/25/2015   CO2 23 04/25/2015    No results found.  Assessment & Plan:   Leniya was seen today for cough.  Diagnoses  and all orders for this visit:  Sinobronchitis  Other orders -     amoxicillin-clavulanate (AUGMENTIN) 875-125 MG tablet; Take 1 tablet by mouth 2 (two) times daily. Take all of this medication -     Pseudoephedrine-Guaifenesin [phone] MG TB12; Take 1 tablet by mouth 2 (two) times daily. For congestion -     HYDROcodone-homatropine (HYCODAN) 5-1.5 MG/5ML syrup; Take 5 mLs by mouth every 6 (six) hours as needed for cough.      I am having Ms. Bourquin start on  amoxicillin-clavulanate, Pseudoephedrine-Guaifenesin, and HYDROcodone-homatropine. I am also having her maintain her zinc gluconate, trimethoprim, estradiol, Methenamine-Sodium Salicylate (CYSTEX PO), Magnesium, Omega-3, UNABLE TO FIND, Astaxanthin, acetaminophen, CINNAMON PO, Calcium Carb-Cholecalciferol, and Vitamin B-12.  Meds ordered this encounter  Medications  . amoxicillin-clavulanate (AUGMENTIN) 875-125 MG tablet    Sig: Take 1 tablet by mouth 2 (two) times daily. Take all of this medication    Dispense:  20 tablet    Refill:  0  . Pseudoephedrine-Guaifenesin [phone] MG TB12    Sig: Take 1 tablet by mouth 2 (two) times daily. For congestion    Dispense:  20 each    Refill:  0  . HYDROcodone-homatropine (HYCODAN) 5-1.5 MG/5ML syrup    Sig: Take 5 mLs by mouth every 6 (six) hours as needed for cough.    Dispense:  120 mL    Refill:  0     Follow-up: No Follow-up on file.  Claretta Fraise, M.D.

## 2016-03-15 DIAGNOSIS — R399 Unspecified symptoms and signs involving the genitourinary system: Secondary | ICD-10-CM | POA: Diagnosis not present

## 2016-04-13 DIAGNOSIS — R3989 Other symptoms and signs involving the genitourinary system: Secondary | ICD-10-CM | POA: Diagnosis not present

## 2016-05-24 ENCOUNTER — Ambulatory Visit (INDEPENDENT_AMBULATORY_CARE_PROVIDER_SITE_OTHER): Payer: Medicare Other | Admitting: *Deleted

## 2016-05-24 VITALS — BP 130/77 | HR 64 | Temp 97.0°F | Ht 65.0 in | Wt 203.0 lb

## 2016-05-24 DIAGNOSIS — Z0000 Encounter for general adult medical examination without abnormal findings: Secondary | ICD-10-CM

## 2016-05-24 DIAGNOSIS — Z23 Encounter for immunization: Secondary | ICD-10-CM | POA: Diagnosis not present

## 2016-05-24 NOTE — Progress Notes (Signed)
Subjective:   Kristen Becker is a 66 y.o. female who presents for an Initial Medicare Annual Wellness Visit.  Patient here today for medicare annual wellness exam. She is a 65 year old who lives at home with her husband. 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 She cooks meals three times a day and they are fairly healthy. She enjoys farming, fishing and going to [employer]. She attends church on sundays, afterward they have a big family lunch. She has 2 sons, one lives here and the other in [place]. She does have stairs in her home and she uses them regularly. She is aware of fall / tripping hazards. She states that her health is better than it was one year ago.         Objective:    Today's Vitals   05/24/16 1532  BP: 130/77  Pulse: 64  Temp: 97 F (36.1 C)  TempSrc: Oral  Weight: 203 lb (92.1 kg)  Height: 5\' 5"  (1.651 m)   Body mass index is 33.78 kg/m.   Current Medications (verified) Outpatient Encounter Prescriptions as of 05/24/2016  Medication Sig  . Astaxanthin 4 MG CAPS Take 3 tablets by mouth.   . Biotin 2500 MCG CAPS Take 2 tablets by mouth daily. Hair and nails  . estradiol (ESTRACE) 0.1 MG/GM vaginal cream Place 1 Applicatorful vaginally at bedtime.  Marland Kitchen LYSINE PO Take 1,000 mg by mouth daily.  . Methylsulfonylmethane 1000 MG TABS Take 4 tablets by mouth daily.  . Prenatal Vit-Fe Fumarate-FA (M-VIT PO) Take 2 tablets by mouth daily. High absorb.  . Probiotic Product (PROBIOTIC ADVANCED PO) Take 1 capsule by mouth daily.  Marland Kitchen trimethoprim (TRIMPEX) 100 MG tablet Take 100 mg by mouth daily.   . [DISCONTINUED] acetaminophen (TYLENOL) 500 MG tablet Take by mouth.  . [DISCONTINUED] amoxicillin-clavulanate (AUGMENTIN) 875-125 MG tablet Take 1 tablet by mouth 2 (two) times daily. Take all of this medication  . [DISCONTINUED] Calcium Carb-Cholecalciferol (CALCIUM 500 + D3) 500-600 MG-UNIT TABS Take 1 tablet by mouth 2  (two) times daily.  . [DISCONTINUED] CINNAMON PO Take by mouth. 1/2 tsp honey and 1/2 tsp cinnamon mixed together twice daily  . [DISCONTINUED] Cyanocobalamin (VITAMIN B-12) 5000 MCG SUBL Place under the tongue daily.  . [DISCONTINUED] HYDROcodone-homatropine (HYCODAN) 5-1.5 MG/5ML syrup Take 5 mLs by mouth every 6 (six) hours as needed for cough.  . [DISCONTINUED] Magnesium 500 MG TABS Take 500 mg by mouth 2 (two) times daily.  . [DISCONTINUED] Methenamine-Sodium Salicylate (CYSTEX PO) Take by mouth. 1 tablespoon daily  . [DISCONTINUED] Omega-3 1000 MG CAPS Take 2 g by mouth.  . [DISCONTINUED] Pseudoephedrine-Guaifenesin [phone] MG TB12 Take 1 tablet by mouth 2 (two) times daily. For congestion  . [DISCONTINUED] UNABLE TO FIND Take by mouth.  . [DISCONTINUED] zinc gluconate 50 MG tablet Take 50 mg by mouth daily.   No facility-administered encounter medications on file as of 05/24/2016.     Allergies (verified) Codeine and Sulfa antibiotics   History: Past Medical History:  Diagnosis Date  . Lumbar vertebral fracture ([facility])    L1 from a fall.  . Menopause   . Neuropathy    seen spine specialist  - no symptoms of neuropathy  . Sleep apnea 2010   CPAP   . TMJ (temporomandibular joint syndrome)    Past Surgical History:  Procedure Laterality Date  . DILATION AND CURETTAGE OF UTERUS  twice  -  last was 1994  . TONSILLECTOMY     Family History  Problem Relation Age of Onset  . Heart disease Mother     CHF  . Cancer Father     prostate  . Heart disease Father   . Hypertension Father   . Neuropathy Brother   . Irregular heart beat Brother   . Arthritis Brother     rhemotoid and seudo gout  . Neuropathy Brother   . Neuropathy Brother   . Neuropathy Brother   . Neuropathy Brother   . Kidney Stones Son   . Diabetes Maternal Grandmother    Social History   Occupational History  . Not on file.   Social History Main Topics  . Smoking status: Never Smoker  . Smokeless  tobacco: Never Used  . Alcohol use No  . Drug use: No  . Sexual activity: Yes    Tobacco Counseling Counseling given: Not Answered she does not use any tobacco products.  Activities of Daily Living In your present state of health, do you have any difficulty performing the following activities: 05/24/2016  Hearing? N  Vision? Y  Difficulty concentrating or making decisions? N  Walking or climbing stairs? N  Dressing or bathing? N  Doing errands, shopping? N  Some recent data might be hidden   She wears RX glasses everyday.  Immunizations and Health Maintenance Immunization History  Administered Date(s) Administered  . Influenza, High Dose Seasonal PF 01/14/2016  . Influenza,inj,Quad PF,36+ Mos 12/05/2013  . Pneumococcal Conjugate-13 05/24/2016  . Tdap 09/25/2015  . Zoster 10/25/2013   There are no preventive care reminders to display for this patient.  Patient Care Team: Worthy Rancher, MD as PCP - General (Family Medicine) Leroy Sea, MD as Referring Physician (Urology) Feliz Beam, MD as Referring Physician (Ophthalmology) Baird Kay, MD as Referring Physician (Specialist) Johnnette Gourd, MD (Physical Medicine and Rehabilitation)  Indicate any recent Medical Services you may have received from other than [facility] providers in the past year (date may be approximate).     Assessment:   This is a routine wellness examination for Kristen Becker.   Hearing/Vision screen No exam data present She wears glasses everyday and has no problems with her hearing.  Dietary issues and exercise activities discussed:    Goals    . Weight (lb) < 175 lb (79.4 kg)          Pt's goal weight is 175 lb       Depression Screen PHQ 2/9 Scores 05/24/2016 02/11/2016 10/06/2015 09/25/2015  PHQ - 2 Score 0 0 0 0    Fall Risk Fall Risk  05/24/2016 02/11/2016 10/06/2015 09/25/2015  Falls in the past year? Yes No Yes Yes  Number falls in past yr: 1 - 1 1  Injury with Fall? Yes - Yes  Yes  Risk for fall due to : - - - History of fall(s)    Cognitive Function: MMSE - Mini Mental State Exam 05/24/2016  Orientation to time 5  Orientation to Place 5  Registration 3  Attention/ Calculation 5  Recall 3  Language- name 2 objects 2  Language- repeat 1  Language- follow 3 step command 3  Language- read & follow direction 1  Write a sentence 1  Copy design 1  Total score 30  she scored today 30 / 30.       Screening Tests Health Maintenance  Topic Date Due  . DEXA SCAN  06/28/2016 (Originally 04/16/2015)  . COLONOSCOPY  12/24/2016 (Originally 07/22/2015)  . INFLUENZA VACCINE  09/08/2016  . MAMMOGRAM  11/25/2016  . PNA vac Low Risk Adult (2 of 2 - PPSV23) 05/24/2017  . TETANUS/TDAP  09/24/2025  . Hepatitis C Screening  Completed      Plan:     Follow up with PCP = Dr Laurella Tull She will need to call her GI office and set up an appointment Today - we will give you a Prevnar 13 (baby pneumonia) She will Review the Advanced directives   During the course of the visit, Kristen Becker was educated and counseled about the following appropriate screening and preventive services:   Vaccines to include Pneumoccal, Influenza, Hepatitis B, Td, Zostavax, HCV  Electrocardiogram  Cardiovascular disease screening  Colorectal cancer screening  Bone density screening  Diabetes screening  Glaucoma screening  Mammography/PAP  Nutrition counseling  Smoking cessation counseling  Patient Instructions (the written plan) were given to the patient.    Bullins, Cameron Proud, LPN   2/75/1700    I have reviewed and agree with the above AWV documentation.   Caryl Pina, MD [facility] 05/24/2016, 9:37 PM

## 2016-05-24 NOTE — Patient Instructions (Signed)
  Kristen Becker , Thank you for taking time to come for your Medicare Wellness Visit. I appreciate your ongoing commitment to your health goals. Please review the following plan we discussed and let me know if I can assist you in the future.   These are the goals we discussed: Goals    . Weight (lb) < 175 lb (79.4 kg)          Pt's goal weight is 175 lb        This is a list of the screening recommended for you and due dates:  Health Maintenance  Topic Date Due  . Pneumonia vaccines (1 of 2 - PCV13) 04/16/2015  . DEXA scan (bone density measurement)  06/28/2016*  . Colon Cancer Screening  12/24/2016*  . Flu Shot  09/08/2016  . Mammogram  11/25/2016  . Tetanus Vaccine  09/24/2025  .  Hepatitis C: One time screening is recommended by Center for Disease Control  (CDC) for  adults born from [birth year] through 1965.   Completed  *Topic was postponed. The date shown is not the original due date.  follow up with Dr Dettinger = DEXA , CXR, and EKG  You need to call your GI office and set up an appointment Today - we will give you a Prevnar 26 (baby pneumonia) Review the Advanced directives

## 2016-06-03 ENCOUNTER — Ambulatory Visit (INDEPENDENT_AMBULATORY_CARE_PROVIDER_SITE_OTHER): Payer: Medicare Other | Admitting: Family Medicine

## 2016-06-03 ENCOUNTER — Ambulatory Visit (INDEPENDENT_AMBULATORY_CARE_PROVIDER_SITE_OTHER): Payer: Medicare Other

## 2016-06-03 ENCOUNTER — Encounter: Payer: Self-pay | Admitting: Family Medicine

## 2016-06-03 VITALS — BP 108/68 | HR 59 | Temp 97.4°F | Ht 65.0 in | Wt 199.5 lb

## 2016-06-03 DIAGNOSIS — Z6833 Body mass index (BMI) 33.0-33.9, adult: Secondary | ICD-10-CM

## 2016-06-03 DIAGNOSIS — R002 Palpitations: Secondary | ICD-10-CM

## 2016-06-03 DIAGNOSIS — T378X5S Adverse effect of other specified systemic anti-infectives and antiparasitics, sequela: Secondary | ICD-10-CM

## 2016-06-03 DIAGNOSIS — I444 Left anterior fascicular block: Secondary | ICD-10-CM

## 2016-06-03 DIAGNOSIS — E669 Obesity, unspecified: Secondary | ICD-10-CM

## 2016-06-03 DIAGNOSIS — Z780 Asymptomatic menopausal state: Secondary | ICD-10-CM

## 2016-06-03 DIAGNOSIS — Z131 Encounter for screening for diabetes mellitus: Secondary | ICD-10-CM

## 2016-06-03 IMAGING — DX DG CHEST 2V
2 series · 2 of 2 positions shown · non-contrast
Comparison: None

CLINICAL DATA: Adverse effects of nitrofurantoin, shortness of
breath on exertion

EXAM:
CHEST  2 VIEW

[chest pa]
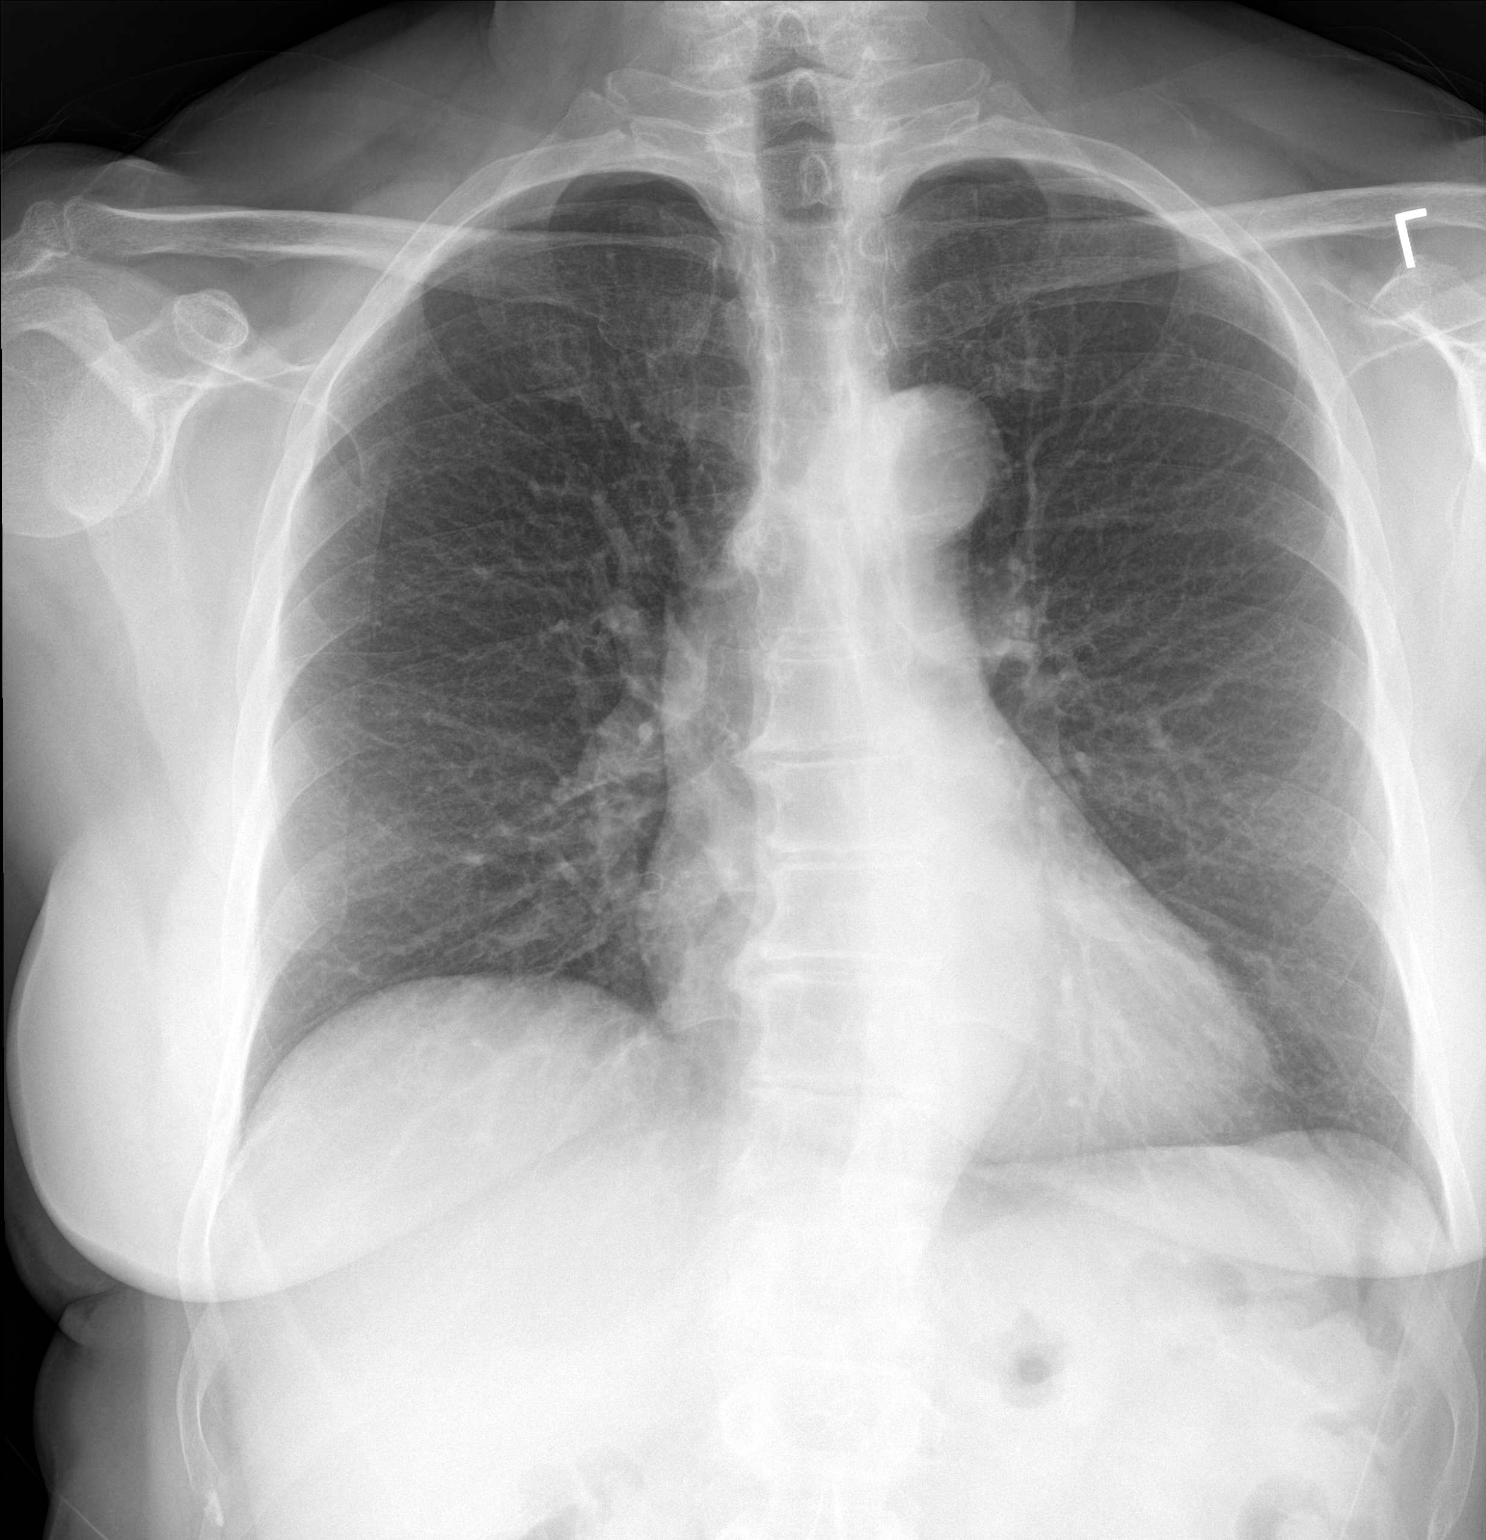

[chest lat]
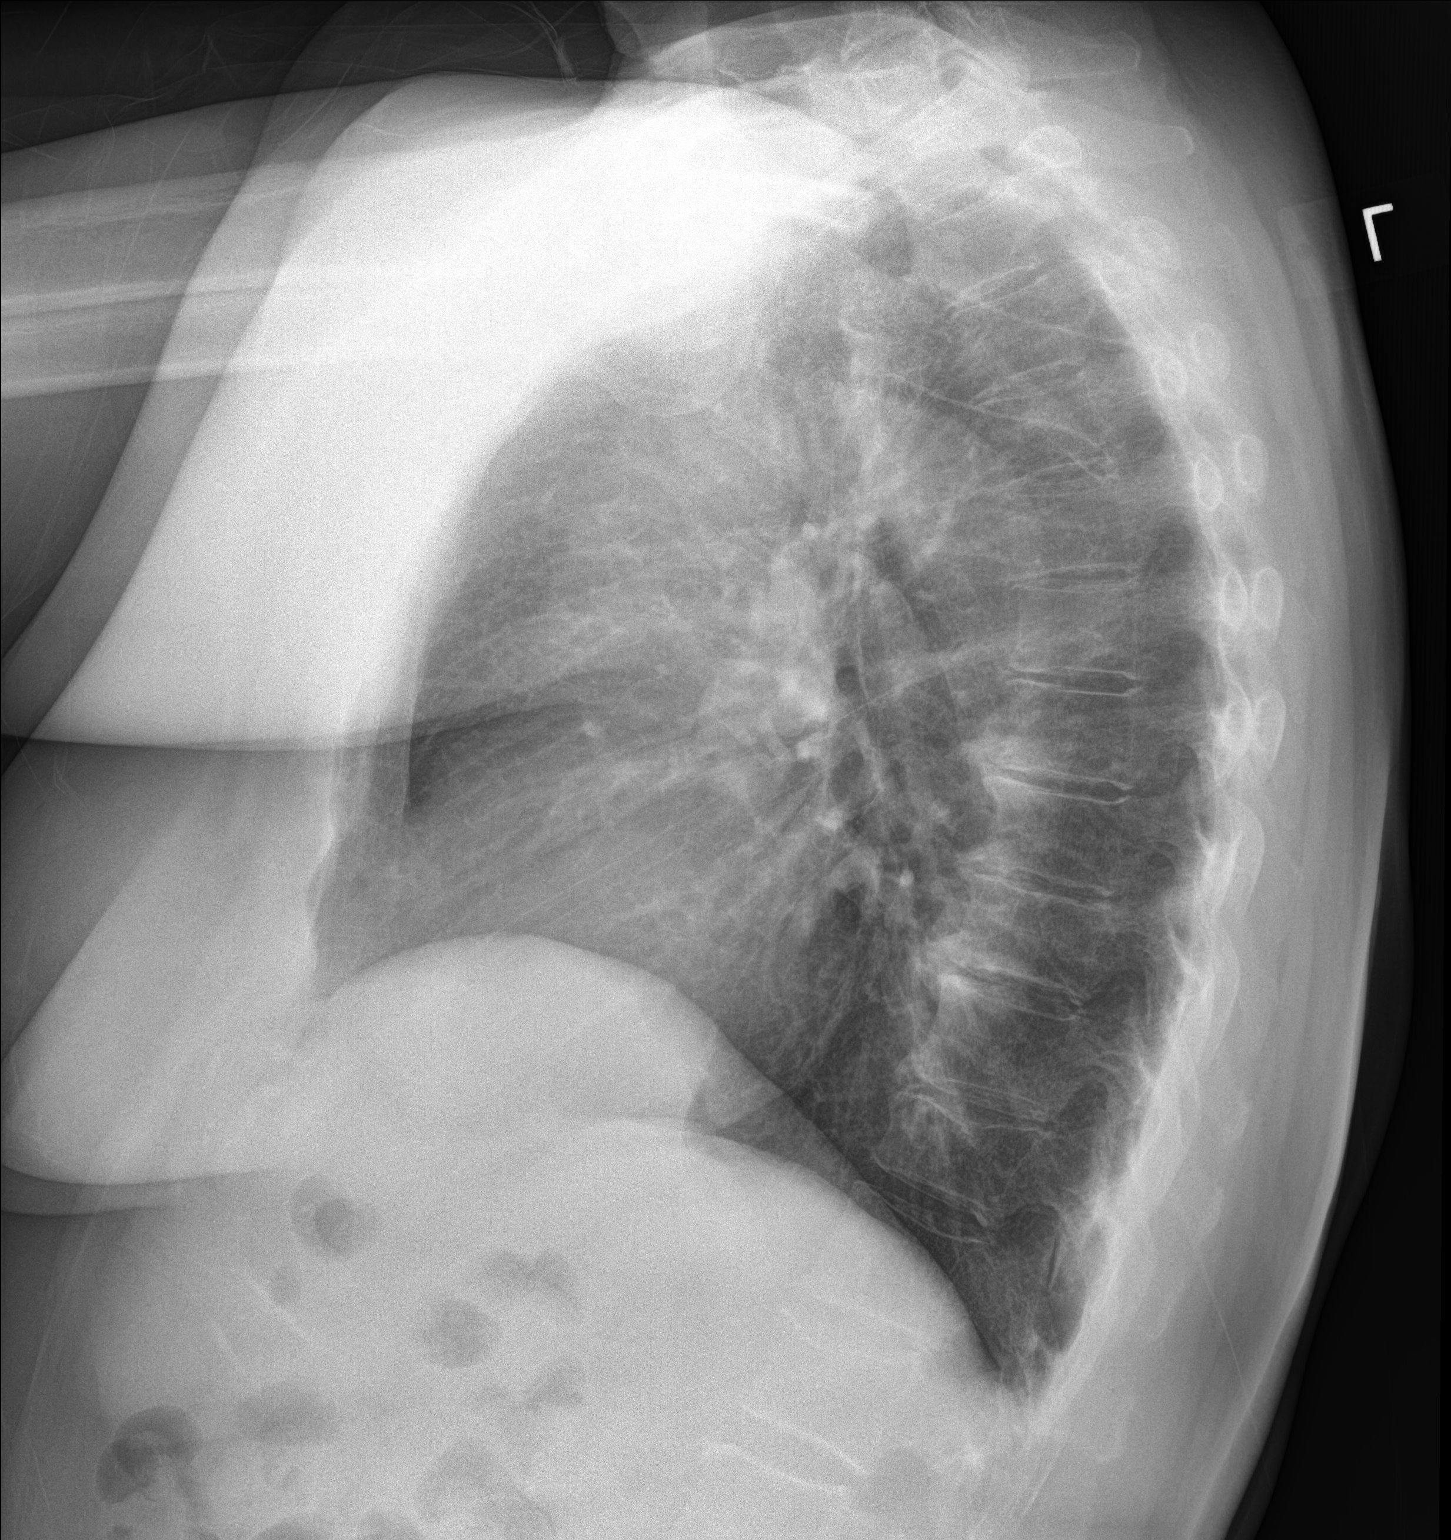

[2 of 2 positions shown; findings below may reference images not displayed]

FINDINGS: Upper normal size of cardiac silhouette.

Tortuous thoracic aorta with minimal atherosclerotic calcification
at arch.

Pulmonary vascularity normal.

Lungs clear.

No pleural effusion or pneumothorax.

Mild levoconvex scoliosis and degenerative disc disease changes of
the thoracic spine.
IMPRESSION: No acute abnormalities.

Aortic atherosclerosis.

## 2016-06-03 NOTE — Progress Notes (Signed)
BP 108/68   Pulse (!) 59   Temp 97.4 F (36.3 C) (Oral)   Ht _0  (1.651 m)   Wt 199 lb 8 oz (90.5 kg)   BMI 33.20 kg/m    Subjective:    Patient ID: Kristen Becker, female    DOB: 15-Jun-1950, 66 y.o.   MRN: [id number]  HPI: Kristen Becker is a 66 y.o. female presenting on 06/03/2016 for Annual Exam (never had EKG, Dexa, CXR; patient is fasting) and Ingrown Toenail (right foot)   HPI Patient is coming in for labs and checkup. Patient had her annual wellness exam and they recommended for her to get a DEXA scan which she has never had because she is postmenopausal. She denies any issues with fractures in the history but has never had a DEXA scan. Patient is obese and knows about that and discussed getting tested to make sure she doesn't have any abnormalities that could cause it and get tested for diabetes and cholesterol as well. Patient says that she has a history of chronic Macrobid use and was recommended to get a chest x-ray to make sure there is no pulmonary scarring. She was also recommended to get an EKG as part of screening. She does complain of a chronic cough that has caused her some issues. She denies any shortness of breath or wheezing. She denies any chest pain or lightheadedness or dizziness.  Relevant past medical, surgical, family and social history reviewed and updated as indicated. Interim medical history since our last visit reviewed. Allergies and medications reviewed and updated.  Review of Systems  Constitutional: Negative for chills and fever.  HENT: Negative for ear pain and tinnitus.   Eyes: Negative for pain.  Respiratory: Positive for cough. Negative for shortness of breath and wheezing.   Cardiovascular: Positive for palpitations. Negative for chest pain and leg swelling.  Gastrointestinal: Negative for abdominal pain, blood in stool, constipation and diarrhea.  Genitourinary: Negative for dysuria and hematuria.  Musculoskeletal: Negative for back pain and  myalgias.  Skin: Negative for rash.  Neurological: Negative for dizziness, weakness and headaches.  Psychiatric/Behavioral: Negative for suicidal ideas.    Per HPI unless specifically indicated above        Objective:    BP 108/68   Pulse (!) 59   Temp 97.4 F (36.3 C) (Oral)   Ht _1  (1.651 m)   Wt 199 lb 8 oz (90.5 kg)   BMI 33.20 kg/m   Wt Readings from Last 3 Encounters:  06/03/16 199 lb 8 oz (90.5 kg)  05/24/16 203 lb (92.1 kg)  02/11/16 204 lb (92.5 kg)    Physical Exam  Constitutional: She is oriented to person, place, and time. She appears well-developed and well-nourished. No distress.  Eyes: Conjunctivae are normal.  Neck: Neck supple. No thyromegaly present.  Cardiovascular: Normal rate, regular rhythm, normal heart sounds and intact distal pulses.   No murmur heard. Pulmonary/Chest: Effort normal and breath sounds normal. No respiratory distress. She has no wheezes. She has no rales.  Musculoskeletal: Normal range of motion. She exhibits no edema or tenderness.  Lymphadenopathy:    She has no cervical adenopathy.  Neurological: She is alert and oriented to person, place, and time. Coordination normal.  Skin: Skin is warm and dry. No rash noted. She is not diaphoretic.  Psychiatric: She has a normal mood and affect. Her behavior is normal.  Nursing note and vitals reviewed.   Chest x-ray: No acute cardiopulmonary abnormalities noted, await final  read by radiologist  ekg: Left anterior fascicular hemiblock, no previous EKG to compare to, we'll do referral for further testing.    Assessment & Plan:   Problem List Items Addressed This Visit      Other   Obesity   Relevant Orders   Lipid panel   EKG 12-Lead (Completed)   TSH    Other Visit Diagnoses    Postmenopausal    -  Primary   Relevant Orders   DG Bone Density   Palpitations       Relevant Orders   TSH   Screening for diabetes mellitus (DM)       Relevant Orders   CMP14+EGFR   Adverse  effect of nitrofurantoin, sequela       Relevant Orders   DG Chest 2 View   Left anterior fascicular hemiblock       This is a new EKG findings, we don't have an old one to compare to and patient has been having more shortness of breath on exertion than she had previously   Relevant Orders   Ambulatory referral to Cardiology       Follow up plan: Return in about 1 year (around 06/03/2017), or if symptoms worsen or fail to improve.  Counseling provided for all of the vaccine components Orders Placed This Encounter  Procedures  . DG Bone Density  . DG Chest 2 View  . CMP14+EGFR  . Lipid panel  . TSH  . EKG 12-Lead    Caryl Pina, MD [facility] 06/03/2016, 8:42 AM

## 2016-06-04 LAB — CMP14+EGFR
A/G RATIO: 2.1 (ref 1.2–2.2)
ALT: 27 IU/L (ref 0–32)
AST: 18 IU/L (ref 0–40)
Albumin: 4.4 g/dL (ref 3.6–4.8)
Alkaline Phosphatase: 78 IU/L (ref 39–117)
BUN / CREAT RATIO: 13 (ref 12–28)
BUN: 12 mg/dL (ref 8–27)
Bilirubin Total: 0.4 mg/dL (ref 0.0–1.2)
CHLORIDE: 102 mmol/L (ref 96–106)
CO2: 25 mmol/L (ref 18–29)
CREATININE: 0.9 mg/dL (ref 0.57–1.00)
Calcium: 9.4 mg/dL (ref 8.7–10.3)
GFR calc non Af Amer: 67 mL/min/{1.73_m2} (ref >59)
GFR, EST AFRICAN AMERICAN: 77 mL/min/{1.73_m2} (ref >59)
GLUCOSE: 87 mg/dL (ref 65–99)
Globulin, Total: 2.1 g/dL (ref 1.5–4.5)
Potassium: 4.2 mmol/L (ref 3.5–5.2)
Sodium: 143 mmol/L (ref 134–144)
Total Protein: 6.5 g/dL (ref 6.0–8.5)

## 2016-06-04 LAB — LIPID PANEL
CHOLESTEROL TOTAL: 201 mg/dL — AB (ref 100–199)
Chol/HDL Ratio: 2.7 ratio (ref 0.0–4.4)
HDL: 74 mg/dL (ref >39)
LDL CALC: 111 mg/dL — AB (ref 0–99)
TRIGLYCERIDES: 80 mg/dL (ref 0–149)
VLDL Cholesterol Cal: 16 mg/dL (ref 5–40)

## 2016-06-04 LAB — TSH: TSH: 1.39 u[IU]/mL (ref 0.450–4.500)

## 2016-06-07 ENCOUNTER — Ambulatory Visit (INDEPENDENT_AMBULATORY_CARE_PROVIDER_SITE_OTHER): Payer: Medicare Other

## 2016-06-07 DIAGNOSIS — Z780 Asymptomatic menopausal state: Secondary | ICD-10-CM

## 2016-06-08 NOTE — Progress Notes (Signed)
Cardiology Office Note NEW PATIENT VISIT   Date:  06/09/2016   ID:  Kristen Becker, DOB Dec 12, 1950, MRN [id number]  PCP:  Worthy Rancher, MD  Cardiologist:  New Dr. Tamala Julian     Chief Complaint  Patient presents with  . Abnormal ECG     new patient      History of Present Illness: Kristen Becker is a 66 y.o. female who presents for abnormal EKG.   She has a hx. of Lumbar vertebral fracture ([place]); Menopause; Neuropathy ([place]); Sleep apnea; and TMJ (temporomandibular joint syndrome). also UTIs , trigonitis.   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   She wears cpap every night since 2010 and has no problems.  She is active, she walked 2 miles this AM without any pain or pressure.  At rest she does have "agitation" in her chest sometimes she calls it a pressure goes through to her back.  But this only occurs with rest and never awakens from sleep.  She walked 2 miles this AM and no problems.  When she arrived here no parking place and agitation occurred only lasted short time.   She has never had chest pain or cardiac problem.  Mother's heart disease was irregular HR and CHF she refused any treatment and diet at 86. Father died from prostate cancer but had HTN and unknown if heart disease.  Brothers without CAD once has arrhthymia.   She did have episode in 2008 of possible syncope fell off farm equipment and fx T11-L1 and had surgery.  But no episodes like this again.  And never before that.         Past Medical History:  Diagnosis Date  . Lumbar vertebral fracture ([facility])    L1 from a fall.  . Menopause   . Neuropathy    seen spine specialist  - no symptoms of neuropathy  . Sleep apnea 2010   CPAP   . TMJ (temporomandibular joint syndrome)     Past Surgical History:  Procedure Laterality Date  . DILATION AND CURETTAGE OF UTERUS  twice  - last was 1994  . TONSILLECTOMY       Current Outpatient Prescriptions  Medication Sig Dispense Refill  . Astaxanthin 4 MG CAPS Take 3  tablets by mouth.     . Biotin 2500 MCG CAPS Take 2 tablets by mouth daily. Hair and nails    . estradiol (ESTRACE) 0.1 MG/GM vaginal cream Place 1 Applicatorful vaginally at bedtime.    Marland Kitchen LYSINE PO Take 1,000 mg by mouth daily.    . Methylsulfonylmethane 1000 MG TABS Take 4 tablets by mouth daily.    Marland Kitchen OVER THE COUNTER MEDICATION 600 mg daily. R-Lipoic Acid    . Prenatal Vit-Fe Fumarate-FA (M-VIT PO) Take 2 tablets by mouth daily. High absorb.    . Probiotic Product (PROBIOTIC ADVANCED PO) Take 1 capsule by mouth daily.    Marland Kitchen trimethoprim (TRIMPEX) 100 MG tablet Take 100 mg by mouth daily.      No current facility-administered medications for this visit.     Allergies:   Codeine and Sulfa antibiotics    Social History:  The patient  reports that she has never smoked. She has never used smokeless tobacco. She reports that she does not drink alcohol or use drugs.   Family History:  The patient's family history includes Arthritis in her brother; Cancer in her father; Diabetes in her maternal grandmother; Heart disease in her father and mother; Hypertension in her  father; Irregular heart beat in her brother; Kidney Stones in her son; Neuropathy in her brother, brother, brother, brother, and brother.    ROS:  General:no colds or fevers, no weight changes Skin:no rashes or ulcers HEENT:no blurred vision, no congestion CV:see HPI PUL:see HPI GI:no diarrhea constipation or melena, no indigestion GU:no hematuria, no dysuria MS:no joint pain, no claudication Neuro:no syncope, no lightheadedness Endo:no diabetes, no thyroid disease  LDL 111 and recent thyroid normal.   Wt Readings from Last 3 Encounters:  06/09/16 198 lb 8 oz (90 kg)  06/03/16 199 lb 8 oz (90.5 kg)  05/24/16 203 lb (92.1 kg)     PHYSICAL EXAM: VS:  BP 108/70   Pulse 69   Ht 5\' 5"  (1.651 m)   Wt 198 lb 8 oz (90 kg)   SpO2 98%   BMI 33.03 kg/m  , BMI Body mass index is 33.03 kg/m. General:Pleasant affect,  NAD Skin:Warm and dry, brisk capillary refill HEENT:normocephalic, sclera clear, mucus membranes moist Neck:supple, no JVD, no bruits  Heart:S1S2 RRR without murmur, gallup, rub or click Lungs:clear without rales, rhonchi, or wheezes ZOX:WRUE, non tender, + BS, do not palpate liver spleen or masses Ext:no lower ext edema, 2+ pedal pulses, 2+ radial pulses Neuro:alert and oriented, MAE, follows commands, + facial symmetry    EKG:  EKG is ordered today. The ekg ordered today demonstrates SR R wave progression is improved.  No old ant MI    Recent Labs: 06/03/2016: ALT 27; BUN 12; Creatinine, Ser 0.90; Potassium 4.2; Sodium 143; TSH 1.390    Lipid Panel    Component Value Date/Time   CHOL 201 (H) 06/03/2016 0915   TRIG 80 06/03/2016 0915   HDL 74 06/03/2016 0915   CHOLHDL 2.7 06/03/2016 0915   LDLCALC 111 (H) 06/03/2016 0915       Other studies Reviewed: Additional studies/ records that were reviewed today include: previous OV notes and urology, ortho notes..   ASSESSMENT AND PLAN:  1. Abnormal EKG- repeat is different with normal R wave progression - no old ant MI.  Dr. Tamala Julian has seen and examined.  No further work up though discussed if symptoms increase or they occur with exertion to call us.  She is agreeable with this.  Her LDL is 111.   We will be glad to see if any problems.  2. Atypical chest discomfort - not with exertion only with rest and brief occurs if stressed.    Current medicines are reviewed with the patient today.  The patient Has no concerns regarding medicines.  The following changes have been made:  See above Labs/ tests ordered today include:see above  Disposition:   FU:  see above  Signed, Cecilie Kicks, NP  06/09/2016 11:23 AM    [address], [place], [address] [place], [place] Phone: [phone]; Fax: [phone]

## 2016-06-09 ENCOUNTER — Encounter: Payer: Self-pay | Admitting: Cardiology

## 2016-06-09 ENCOUNTER — Ambulatory Visit (INDEPENDENT_AMBULATORY_CARE_PROVIDER_SITE_OTHER): Payer: Medicare Other | Admitting: Cardiology

## 2016-06-09 VITALS — BP 108/70 | HR 69 | Ht 65.0 in | Wt 198.5 lb

## 2016-06-09 DIAGNOSIS — R0789 Other chest pain: Secondary | ICD-10-CM

## 2016-06-09 DIAGNOSIS — R9431 Abnormal electrocardiogram [ECG] [EKG]: Secondary | ICD-10-CM

## 2016-06-09 NOTE — Patient Instructions (Signed)

## 2016-06-10 ENCOUNTER — Ambulatory Visit (INDEPENDENT_AMBULATORY_CARE_PROVIDER_SITE_OTHER): Payer: Medicare Other | Admitting: Pharmacist

## 2016-06-10 ENCOUNTER — Encounter: Payer: Self-pay | Admitting: Pharmacist

## 2016-06-10 DIAGNOSIS — M85851 Other specified disorders of bone density and structure, right thigh: Secondary | ICD-10-CM

## 2016-06-10 DIAGNOSIS — M8580 Other specified disorders of bone density and structure, unspecified site: Secondary | ICD-10-CM | POA: Insufficient documentation

## 2016-06-10 NOTE — Patient Instructions (Signed)
Exercise for Strong Bones  Exercise is important to build and maintain strong bones / bone density.  There are 2 types of exercises that are important to building and maintaining strong bones:  Weight- bearing and muscle-stregthening.  Weight-bearing Exercises  These exercises include activities that make you move against gravity while staying upright. Weight-bearing exercises can be high-impact or low-impact.  High-impact weight-bearing exercises help build bones and keep them strong. If you have broken a bone due to osteoporosis or are at risk of breaking a bone, you may need to avoid high-impact exercises. If you're not sure, you should check with your healthcare provider.  Examples of high-impact weight-bearing exercises are: Dancing  Doing high-impact aerobics  Hiking  Jogging/running  Jumping Rope  Stair climbing  Tennis  Low-impact weight-bearing exercises can also help keep bones strong and are a safe alternative if you cannot do high-impact exercises.   Examples of low-impact weight-bearing exercises are: Using elliptical training machines  Doing low-impact aerobics  Using stair-step machines  Fast walking on a treadmill or outside   Muscle-Strengthening Exercises These exercises include activities where you move your body, a weight or some other resistance against gravity. They are also known as resistance exercises and include: Lifting weights  Using elastic exercise bands  Using weight machines  Lifting your own body weight  Functional movements, such as standing and rising up on your toes  Yoga and Pilates can also improve strength, balance and flexibility. However, certain positions may not be safe for people with osteoporosis or those at increased risk of broken bones. For example, exercises that have you bend forward may increase the chance of breaking a bone in the spine.   Non-Impact Exercises There are other types of exercises that can help  prevent falls.  Non-impact exercises can help you to improve balance, posture and how well you move in everyday activities. Some of these exercises include: Balance exercises that strengthen your legs and test your balance, such as Tai Chi, can decrease your risk of falls.  Posture exercises that improve your posture and reduce rounded or "sloping" shoulders can help you decrease the chance of breaking a bone, especially in the spine.  Functional exercises that improve how well you move can help you with everyday activities and decrease your chance of falling and breaking a bone. For example, if you have trouble getting up from a chair or climbing stairs, you should do these activities as exercises.   **A physical therapist can teach you balance, posture and functional exercises. He/she can also help you learn which exercises are safe and appropriate for you.  [facility] has a physical therapy office in [place] in front of our office and referrals can be made for assessments and treatment as needed and strength and balance training.  If you would like to have an assessment with Kristen Becker and our physical therapy team please let a nurse or provider know.   Fall Prevention in the Home Falls can cause injuries and can affect people from all age groups. There are many simple things that you can do to make your home safe and to help prevent falls. What can I do on the outside of my home?  Regularly repair the edges of walkways and driveways and fix any cracks.  Remove high doorway thresholds.  Trim any shrubbery on the main path into your home.  Use bright outdoor lighting.  Clear walkways of debris and clutter, including tools and rocks.  Regularly check that handrails   are securely fastened and in good repair. Both sides of any steps should have handrails.  Install guardrails along the edges of any raised decks or porches.  Have leaves, snow, and ice cleared regularly.  Use sand or salt on  walkways during winter months.  In the garage, clean up any spills right away, including grease or oil spills. What can I do in the bathroom?  Use night lights.  Install grab bars by the toilet and in the tub and shower. Do not use towel bars as grab bars.  Use non-skid mats or decals on the floor of the tub or shower.  If you need to sit down while you are in the shower, use a plastic, non-slip stool.  Keep the floor dry. Immediately clean up any water that spills on the floor.  Remove soap buildup in the tub or shower on a regular basis.  Attach bath mats securely with double-sided non-slip rug tape.  Remove throw rugs and other tripping hazards from the floor. What can I do in the bedroom?  Use night lights.  Make sure that a bedside light is easy to reach.  Do not use oversized bedding that drapes onto the floor.  Have a firm chair that has side arms to use for getting dressed.  Remove throw rugs and other tripping hazards from the floor. What can I do in the kitchen?  Clean up any spills right away.  Avoid walking on wet floors.  Place frequently used items in easy-to-reach places.  If you need to reach for something above you, use a sturdy step stool that has a grab bar.  Keep electrical cables out of the way.  Do not use floor polish or wax that makes floors slippery. If you have to use wax, make sure that it is non-skid floor wax.  Remove throw rugs and other tripping hazards from the floor. What can I do in the stairways?  Do not leave any items on the stairs.  Make sure that there are handrails on both sides of the stairs. Fix handrails that are broken or loose. Make sure that handrails are as long as the stairways.  Check any carpeting to make sure that it is firmly attached to the stairs. Fix any carpet that is loose or worn.  Avoid having throw rugs at the top or bottom of stairways, or secure the rugs with carpet tape to prevent them from  moving.  Make sure that you have a light switch at the top of the stairs and the bottom of the stairs. If you do not have them, have them installed. What are some other fall prevention tips?  Wear closed-toe shoes that fit well and support your feet. Wear shoes that have rubber soles or low heels.  When you use a stepladder, make sure that it is completely opened and that the sides are firmly locked. Have someone hold the ladder while you are using it. Do not climb a closed stepladder.  Add color or contrast paint or tape to grab bars and handrails in your home. Place contrasting color strips on the first and last steps.  Use mobility aids as needed, such as canes, walkers, scooters, and crutches.  Turn on lights if it is dark. Replace any light bulbs that burn out.  Set up furniture so that there are clear paths. Keep the furniture in the same spot.  Fix any uneven floor surfaces.  Choose a carpet design that does not hide the edge   of steps of a stairway.  Be aware of any and all pets.  Review your medicines with your healthcare provider. Some medicines can cause dizziness or changes in blood pressure, which increase your risk of falling. Talk with your health care provider about other ways that you can decrease your risk of falls. This may include working with a physical therapist or trainer to improve your strength, balance, and endurance. This information is not intended to replace advice given to you by your health care provider. Make sure you discuss any questions you have with your health care provider. Document Released: 01/15/2002 Document Revised: 06/24/2015 Document Reviewed: 03/01/2014 Elsevier Interactive Patient Education  2017 [employer].

## 2016-06-10 NOTE — Progress Notes (Signed)
Patient ID: Kristen Becker, female   DOB: [birth year], 66 y.o.   MRN: [id number]     HPI: Kristen Becker is here today to discuss results of her DEXA that was done 06/07/2016  Back Pain?  Yes       Kyphosis?  No Prior fracture?  Yes - 2008 compression fracture after falling from feed grinder  Med(s) for Osteoporosis/Osteopenia:  none Med(s) previously tried for Osteoporosis/Osteopenia:  none                                                             PMH: Age at menopause:  66 yo Hysterectomy?  No Oophorectomy?  No HRT? Yes - Current.  Type/duration: estrace cream recommended by urologist to help strengthen bladder wall Steroid Use?  No Thyroid med?  No History of cancer?  No History of digestive disorders (ie Crohn's)?  No Current or previous eating disorders?  No Last Vitamin D Result:  Not checked Last GFR Result:  67 (06/03/2016)   FH/SH: Family history of osteoporosis?  No Parent with history of hip fracture?  No Family history of breast cancer?  No Exercise?  Yes - walks daily - 2 miles / 30 minutes Smoking?  No Alcohol?  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   Discussed BMD  / DEXA results and discussed fracture risk. 2.  recommend calcium 1200mg  daily through supplementation or diet.  3.  continue weight bearing exercise - 30 minutes at least 4 days per week.   4.  Counseled and educated  about fall risk and prevention. 5.  Vitamin D level added to labs form 07/03/16  Recheck DEXA:  2 years  Time spent counseling patient:  35 minutes

## 2016-06-11 LAB — SPECIMEN STATUS REPORT

## 2016-06-21 DIAGNOSIS — Z1211 Encounter for screening for malignant neoplasm of colon: Secondary | ICD-10-CM | POA: Diagnosis not present

## 2016-09-27 DIAGNOSIS — H43813 Vitreous degeneration, bilateral: Secondary | ICD-10-CM | POA: Diagnosis not present

## 2016-09-27 DIAGNOSIS — H04123 Dry eye syndrome of bilateral lacrimal glands: Secondary | ICD-10-CM | POA: Diagnosis not present

## 2016-10-20 DIAGNOSIS — N390 Urinary tract infection, site not specified: Secondary | ICD-10-CM | POA: Diagnosis not present

## 2016-10-20 DIAGNOSIS — R3989 Other symptoms and signs involving the genitourinary system: Secondary | ICD-10-CM | POA: Diagnosis not present

## 2016-11-22 ENCOUNTER — Ambulatory Visit (INDEPENDENT_AMBULATORY_CARE_PROVIDER_SITE_OTHER): Payer: Medicare Other

## 2016-11-22 DIAGNOSIS — Z23 Encounter for immunization: Secondary | ICD-10-CM

## 2016-12-08 DIAGNOSIS — R112 Nausea with vomiting, unspecified: Secondary | ICD-10-CM | POA: Diagnosis not present

## 2016-12-08 DIAGNOSIS — R1084 Generalized abdominal pain: Secondary | ICD-10-CM | POA: Diagnosis not present

## 2016-12-08 DIAGNOSIS — R197 Diarrhea, unspecified: Secondary | ICD-10-CM | POA: Diagnosis not present

## 2016-12-08 DIAGNOSIS — R109 Unspecified abdominal pain: Secondary | ICD-10-CM | POA: Diagnosis not present

## 2017-03-11 DIAGNOSIS — Z124 Encounter for screening for malignant neoplasm of cervix: Secondary | ICD-10-CM | POA: Diagnosis not present

## 2017-03-11 DIAGNOSIS — Z01419 Encounter for gynecological examination (general) (routine) without abnormal findings: Secondary | ICD-10-CM | POA: Diagnosis not present

## 2017-05-09 ENCOUNTER — Encounter: Payer: Self-pay | Admitting: Family Medicine

## 2017-05-09 ENCOUNTER — Ambulatory Visit (INDEPENDENT_AMBULATORY_CARE_PROVIDER_SITE_OTHER): Payer: Medicare Other | Admitting: Family Medicine

## 2017-05-09 VITALS — BP 116/70 | HR 69 | Temp 98.1°F | Ht 65.0 in | Wt 197.0 lb

## 2017-05-09 DIAGNOSIS — M26609 Unspecified temporomandibular joint disorder, unspecified side: Secondary | ICD-10-CM | POA: Diagnosis not present

## 2017-05-09 MED ORDER — PREDNISONE 20 MG PO TABS: ORAL_TABLET | ORAL | 0 refills | Status: DC

## 2017-05-09 NOTE — Progress Notes (Signed)
BP 116/70   Pulse 69   Temp 98.1 F (36.7 C) (Oral)   Ht 5\' 5"  (1.651 m)   Wt 197 lb (89.4 kg)   BMI 32.78 kg/m    Subjective:    Patient ID: Kristen Becker, female    DOB: 1950-07-10, 67 y.o.   MRN: [id number]  HPI: Victor Granados is a 67 y.o. female presenting on 05/09/2017 for Left ear pain, nasal congestion, sinus drainage, cough (x 2 weeks, has taken OTC sinus meds, allergy medication, used Flonase)   HPI Left ear pain/face pain Patient has been having left ear/jaw pain that is been going on for 2 weeks.  She has had some cough and nasal congestion and sinus drainage for which she has been using over-the-counter sinus meds and allergy medication and Flonase and feels like most of her congestion and drainage is improved but the pain is still there.  She does say the pain is worse with mastication.  She denies having any fevers or chills or shortness of breath or wheezing.  She does stop a cough but it is nonproductive at this point.  Relevant past medical, surgical, family and social history reviewed and updated as indicated. Interim medical history since our last visit reviewed. Allergies and medications reviewed and updated.  Review of Systems  Constitutional: Negative for chills and fever.  HENT: Positive for congestion, postnasal drip, rhinorrhea, sinus pressure, sneezing and sore throat. Negative for ear discharge and ear pain.   Eyes: Negative for pain, redness and visual disturbance.  Respiratory: Positive for cough. Negative for chest tightness and shortness of breath.   Cardiovascular: Negative for chest pain and leg swelling.  Musculoskeletal: Negative for back pain and gait problem.  Skin: Negative for rash.  Neurological: Negative for light-headedness and headaches.  Psychiatric/Behavioral: Negative for agitation and behavioral problems.  All other systems reviewed and are negative.   Per HPI unless specifically indicated above        Objective:    BP  116/70   Pulse 69   Temp 98.1 F (36.7 C) (Oral)   Ht 5\' 5"  (1.651 m)   Wt 197 lb (89.4 kg)   BMI 32.78 kg/m   Wt Readings from Last 3 Encounters:  05/09/17 197 lb (89.4 kg)  06/09/16 198 lb 8 oz (90 kg)  06/03/16 199 lb 8 oz (90.5 kg)    Physical Exam  Constitutional: She is oriented to person, place, and time. She appears well-developed and well-nourished. No distress.  HENT:  Head:    Eyes: Conjunctivae are normal.  Neck: Neck supple. No thyromegaly present.  Cardiovascular: Normal rate, regular rhythm, normal heart sounds and intact distal pulses.  No murmur heard. Pulmonary/Chest: Effort normal and breath sounds normal. No respiratory distress. She has no wheezes. She has no rales.  Lymphadenopathy:    She has no cervical adenopathy.  Neurological: She is alert and oriented to person, place, and time. Coordination normal.  Skin: Skin is warm and dry. No rash noted. She is not diaphoretic.  Psychiatric: She has a normal mood and affect. Her behavior is normal.  Nursing note and vitals reviewed.     Assessment & Plan:   Problem List Items Addressed This Visit      Musculoskeletal and Integument   TMJ (temporomandibular joint syndrome) - Primary   Relevant Medications   predniSONE (DELTASONE) 20 MG tablet      Left-sided  Follow up plan: Return if symptoms worsen or fail to improve.  Counseling provided  for all of the vaccine components No orders of the defined types were placed in this encounter.   Caryl Pina, MD [facility] 05/09/2017, 12:03 PM

## 2017-09-19 ENCOUNTER — Ambulatory Visit (INDEPENDENT_AMBULATORY_CARE_PROVIDER_SITE_OTHER): Payer: Medicare Other | Admitting: Family Medicine

## 2017-09-19 ENCOUNTER — Encounter: Payer: Self-pay | Admitting: Family Medicine

## 2017-09-19 VITALS — BP 118/78 | HR 70 | Temp 97.4°F | Ht 65.0 in | Wt 198.0 lb

## 2017-09-19 DIAGNOSIS — M51379 Other intervertebral disc degeneration, lumbosacral region without mention of lumbar back pain or lower extremity pain: Secondary | ICD-10-CM

## 2017-09-19 DIAGNOSIS — M25561 Pain in right knee: Secondary | ICD-10-CM | POA: Diagnosis not present

## 2017-09-19 DIAGNOSIS — M5137 Other intervertebral disc degeneration, lumbosacral region: Secondary | ICD-10-CM

## 2017-09-19 DIAGNOSIS — M5136 Other intervertebral disc degeneration, lumbar region: Secondary | ICD-10-CM

## 2017-09-19 MED ORDER — MELOXICAM 15 MG PO TABS: 7.5000 mg | ORAL_TABLET | Freq: Every day | ORAL | 0 refills | Status: DC

## 2017-09-19 NOTE — Patient Instructions (Signed)
You have prescribed a nonsteroidal anti-inflammatory drug (NSAID) today. This will help with your pain and inflammation. Please do not take any other NSAIDs (ibuprofen/Motrin/Advil, naproxen/Aleve, meloxicam/Mobic, Voltaren/diclofenac). Please make sure to eat a meal when taking this medication.   Caution:  If you have a history of acid reflux/indigestion, I recommend that you take an antacid (such as Prilosec, Prevacid) daily while on the NSAID.  If you have a history of bleeding disorder, gastric ulcer, are on a blood thinner (like warfarin/Coumadin, Xarelto, Eliquis, etc) please do not take NSAID.  If you have ever had a heart attack, you should not take NSAIDs.   Baker Cyst A Baker cyst, also called a popliteal cyst, is a sac-like growth that forms at the back of the knee. The cyst forms when the fluid-filled sac (bursa) that cushions the knee joint becomes enlarged. The bursa that becomes a Baker cyst is located at the back of the knee joint. What are the causes? In most cases, a Baker cyst results from another knee problem that causes swelling inside the knee. This makes the fluid inside the knee joint (synovial fluid) flow into the bursa behind the knee, causing the bursa to enlarge. What increases the risk? You may be more likely to develop a Baker cyst if you already have a knee problem, such as:  A tear in cartilage that cushions the knee joint (meniscal tear).  A tear in the tissues that connect the bones of the knee joint (ligament tear).  Knee swelling from osteoarthritis, rheumatoid arthritis, or gout.  What are the signs or symptoms? A Baker cyst does not always cause symptoms. A lump behind the knee may be the only sign of the condition. The lump may be painful, especially when the knee is straightened. If the lump is painful, the pain may come and go. The knee may also be stiff. Symptoms may quickly get more severe if the cyst breaks open (ruptures). If your cyst ruptures,  signs and symptoms may affect the knee and the back of the lower leg (calf) and may include:  Sudden or worsening pain.  Swelling.  Bruising.  How is this diagnosed? This condition may be diagnosed based on your symptoms and medical history. Your health care provider will also do a physical exam. This may include:  Feeling the cyst to check whether it is tender.  Checking your knee for signs of another knee condition that causes swelling.  You may have imaging tests, such as:  X-rays.  MRI.  Ultrasound.  How is this treated? A Baker cyst that is not painful may go away without treatment. If the cyst gets large or painful, it will likely get better if the underlying knee problem is treated. Treatment for a Baker cyst may include:  Resting.  Keeping weight off of the knee. This means not leaning on the knee to support your body weight.  NSAIDs to reduce pain and swelling.  A procedure to drain the fluid from the cyst with a needle (aspiration). You may also get an injection of a medicine that reduces swelling (steroid).  Surgery. This may be needed if other treatments do not work. This usually involves correcting knee damage and removing the cyst.  Follow these instructions at home:  Take over-the-counter and prescription medicines only as told by your health care provider.  Rest and return to your normal activities as told by your health care provider. Avoid activities that make pain or swelling worse. Ask your health care provider  what activities are safe for you.  Keep all follow-up visits as told by your health care provider. This is important. Contact a health care provider if:  You have knee pain, stiffness, or swelling that does not get better. Get help right away if:  You have sudden or worsening pain and swelling in your calf area. This information is not intended to replace advice given to you by your health care provider. Make sure you discuss any questions  you have with your health care provider. Document Released: 01/25/2005 Document Revised: 10/16/2015 Document Reviewed: 10/16/2015 Elsevier Interactive Patient Education  2018 [employer].

## 2017-09-19 NOTE — Progress Notes (Signed)
Subjective: CC: knee pain PCP: Dettinger, Fransisca Kaufmann, MD ZOX:[id number] [name] is a 67 y.o. female presenting to clinic today for:  1. Knee pain Patient reports acute on chronic onset of right-sided knee pain over the last couple of weeks.  She notes that symptoms seem to come to ahead over the weekend.  She cites that on Friday, she was walking around trying to shop when the knee pain became very limiting in her ability to walk.  Symptoms seem to be worse with weightbearing activities or getting out of the car.  She points to the right posterior knee citing that that seem to be where it was most painful.  She notes associated swelling.  Pain was a cramping sensation in the muscle.  She performed rice activities at home and use ibuprofen with some improvement but not substantial improvement.  She notes that she has had chronic bilateral lower extremity pain radiating down into the feet that she thinks may be coming from her back.  She cites that she has had a history of degenerative changes in the lumbar spine that was previously treated by [facility], Dr. Alanson Puls and Dr. Posey Pronto but she notes that they have left the practice.  She brings in copies of her imaging studies which noted moderate degenerative disc disease in the L5 on S1 levels.  She also had mild arthritic changes in bilateral feet noted on x-rays.  She had injections performed into the lumbar regions previously.  This seemed to help for some time but symptoms have returned.  Last injection performed in July 2017.   ROS: Per HPI  Allergies  Allergen Reactions  . Codeine Rash  . Sulfa Antibiotics Rash   Past Medical History:  Diagnosis Date  . Lumbar vertebral fracture (HCC)    L1 from a fall.  . Menopause   . Neuropathy    seen spine specialist  - no symptoms of neuropathy  . Sleep apnea 2010   CPAP   . TMJ (temporomandibular joint syndrome)     Current Outpatient Medications:  .  Astaxanthin 4 MG  CAPS, Take 1 tablet by mouth. , Disp: , Rfl:  .  estradiol (ESTRACE) 0.1 MG/GM vaginal cream, Place 1 Applicatorful vaginally at bedtime., Disp: , Rfl:  .  Methylsulfonylmethane 1000 MG TABS, Take 4 tablets by mouth daily., Disp: , Rfl:  .  OVER THE COUNTER MEDICATION, 600 mg daily. R-Lipoic Acid, Disp: , Rfl:  .  trimethoprim (TRIMPEX) 100 MG tablet, Take 100 mg by mouth daily. , Disp: , Rfl:  Social History   Socioeconomic History  . Marital status: Married    Spouse name: Not on file  . Number of children: Not on file  . Years of education: Not on file  . Highest education level: Not on file  Occupational History  . Not on file  Social Needs  . Financial resource strain: Not on file  . Food insecurity:    Worry: Not on file    Inability: Not on file  . Transportation needs:    Medical: Not on file    Non-medical: Not on file  Tobacco Use  . Smoking status: Never Smoker  . Smokeless tobacco: Never Used  Substance and Sexual Activity  . Alcohol use: No  . Drug use: No  . Sexual activity: Yes  Lifestyle  . Physical activity:    Days per week: Not on file    Minutes per session: Not on file  . Stress:  Not on file  Relationships  . Social connections:    Talks on phone: Not on file    Gets together: Not on file    Attends religious service: Not on file    Active member of club or organization: Not on file    Attends meetings of clubs or organizations: Not on file    Relationship status: Not on file  . Intimate partner violence:    Fear of current or ex partner: Not on file    Emotionally abused: Not on file    Physically abused: Not on file    Forced sexual activity: Not on file  Other Topics Concern  . Not on file  Social History Narrative  . Not on file   Family History  Problem Relation Age of Onset  . Heart disease Mother        CHF  . Cancer Father        prostate  . Heart disease Father   . Hypertension Father   . Neuropathy Brother   . Irregular  heart beat Brother   . Arthritis Brother        rhemotoid and seudo gout  . Neuropathy Brother   . Arrhythmia Brother   . Neuropathy Brother   . Neuropathy Brother   . Neuropathy Brother   . Kidney Stones Son   . Diabetes Maternal Grandmother     Objective: Office vital signs reviewed. BP 118/78   Pulse 70   Temp (!) 97.4 F (36.3 C) (Oral)   Ht 5\' 5"  (1.651 m)   Wt 198 lb (89.8 kg)   BMI 32.95 kg/m   Physical Examination:  General: Awake, alert, obese, well appearing, No acute distress Extremities: warm, well perfused, No edema, cyanosis or clubbing; +2 pulses bilaterally MSK: slightly antalgic gait and normal station  Lumbar spine: Patient has full active range of motion all planes.  No midline tenderness to palpation.  Minimal paraspinal muscle tenderness palpation.  No palpable bony abnormalities.  Left hip: Patient has full active range of motion in all planes.  She does have pain with FABER.  Negative FADIR.  Right hip: Patient has full active range of motion in all planes.  Minimal discomfort with FABER.  Negative FADIR.  Right knee: Patient has full active range of motion in extension.  She has slight decreased active range of motion in flexion.  Minimal soft tissue swelling appreciated along the outer aspects of the right knee anteriorly.  No gross effusion or discoloration appreciated.  No tenderness to palpation to the patella, patellar tendon, quad tendon or posterior popliteal fossa.  She does have mild tenderness along the medial aspect of the knee bilaterally.  No ligamentous laxity.  She has a positive Thessaly's. Skin: dry; intact; no rashes or lesions Neuro: 5/5 LE Strength and light touch sensation grossly intact  Assessment/ Plan: 67 y.o. female   1. Degenerative disc disease at L5-S1 level I think that the chronic lower extremity pain is likely secondary to degenerative changes within the lumbar spine.  I placed a referral to orthopedics for further  evaluation and consideration of injections. - Ambulatory referral to Orthopedic Surgery  2. Acute pain of right knee Likely a meniscal injury given positive Thessaly and medial joint line tenderness.  There is no gross effusion.  I do question whether or not the posterior pain was likely secondary to a Baker's cyst associated with meniscal injury.  I have placed her on meloxicam 7.5 mg to 15 mg  daily with a meal.  Avoid other NSAIDs.  Reasons for return discussed.  Follow-up as needed. - Ambulatory referral to Orthopedic Surgery   Orders Placed This Encounter  Procedures  . Ambulatory referral to Orthopedic Surgery    Referral Priority:   Routine    Referral Type:   Surgical    Referral Reason:   Specialty Services Required    Requested Specialty:   Orthopedic Surgery    Number of Visits Requested:   1   Meds ordered this encounter  Medications  . meloxicam (MOBIC) 15 MG tablet    Sig: Take 0.5-1 tablets (7.5-15 mg total) by mouth daily. (if needed for back/ knee pain)    Dispense:  30 tablet    Refill:  0     Chaunte Hornbeck Windell Moulding, DO [place] [phone]

## 2017-09-28 DIAGNOSIS — H43813 Vitreous degeneration, bilateral: Secondary | ICD-10-CM | POA: Diagnosis not present

## 2017-09-28 DIAGNOSIS — H04123 Dry eye syndrome of bilateral lacrimal glands: Secondary | ICD-10-CM | POA: Diagnosis not present

## 2017-10-13 ENCOUNTER — Ambulatory Visit (INDEPENDENT_AMBULATORY_CARE_PROVIDER_SITE_OTHER): Payer: Medicare Other

## 2017-10-13 ENCOUNTER — Ambulatory Visit (INDEPENDENT_AMBULATORY_CARE_PROVIDER_SITE_OTHER): Payer: Self-pay

## 2017-10-13 ENCOUNTER — Ambulatory Visit (INDEPENDENT_AMBULATORY_CARE_PROVIDER_SITE_OTHER): Payer: Medicare Other | Admitting: Orthopaedic Surgery

## 2017-10-13 ENCOUNTER — Encounter (INDEPENDENT_AMBULATORY_CARE_PROVIDER_SITE_OTHER): Payer: Self-pay | Admitting: Orthopaedic Surgery

## 2017-10-13 VITALS — BP 150/101 | HR 77 | Ht 65.0 in | Wt 196.0 lb

## 2017-10-13 DIAGNOSIS — M545 Low back pain: Secondary | ICD-10-CM | POA: Diagnosis not present

## 2017-10-13 DIAGNOSIS — M25561 Pain in right knee: Secondary | ICD-10-CM

## 2017-10-13 DIAGNOSIS — M79672 Pain in left foot: Secondary | ICD-10-CM | POA: Diagnosis not present

## 2017-10-13 DIAGNOSIS — G8929 Other chronic pain: Secondary | ICD-10-CM

## 2017-10-13 DIAGNOSIS — M79671 Pain in right foot: Secondary | ICD-10-CM

## 2017-10-13 NOTE — Progress Notes (Signed)
Office Visit Note   Patient: Kristen Becker           Date of Birth: 09-Jan-1951           MRN: [id number] Visit Date: 10/13/2017              Requested by: Janora Norlander, DO [address] [place], [place] [address] PCP: Dettinger, Fransisca Kaufmann, MD   Assessment & Plan: Visit Diagnoses:  1. Chronic bilateral low back pain, with sciatica presence unspecified   2. Chronic pain of right knee   3. Bilateral foot pain   4.  Leg pressure elevated today 150/101 she reports is always been normal.  Plan: Patient has multiple orthopedic problems which is preventing her from ambulating in the community and resuming a walking program that she is done for years.  We will start with some EMGs nerve conduction velocities to evaluate her for peripheral neuropathy versus radiculopathy.  We will consider possible single knee injection right knee on return.  Office follow-up after electrical test.  Follow-Up Instructions: No follow-ups on file.   Orders:  Orders Placed This Encounter  Procedures  . XR Knee 1-2 Views Right  . XR Foot Complete Left  . XR Foot Complete Right  . XR Lumbar Spine 2-3 Views  . Ambulatory referral to Neurology   No orders of the defined types were placed in this encounter.     Procedures: No procedures performed   Clinical Data: No additional findings.   Subjective: Chief Complaint  Patient presents with  . Lower Back - Pain  . Right Knee - Pain  . Right Foot - Pain  . Left Foot - Pain    HPI 67 year old female normally sees Dr. Warrick Parisian was referred here by Dr. Ronnie Doss problems with low back pain question sacroiliitis, right knee medial and posterior pain question Baker's cyst, bilateral foot pain with stinging, question peripheral neuropathy, heel spurs plantar fasciitis.  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  She has had low back pain at the  lumbosacral junction with burning in her feet.  Right knee has been painful medially catches is limited her from walking with her husband who has diabetes and they used to walk together.  She is used some Mobic with some improvement.  She has been on Neurontin gradually increasing to a high dose for 3 months but did not get any better.  Faint ankles tend to give her the worse problems is worse with activity bothers her first thing in the morning.  Does better during the day and she has to stop and elevate her feet during the afternoon and gradually by the end of the day burning and pain in the feet are much worse.  Never had electrical test.  No  History of diabetes, no abdominal surgery no thyroid condition, no exposure to heavy metals.  History of cervical radiculopathy but no myelopathic symptoms.  Diagnosed with osteopenia .  Patient brought previous scan lumbar from 2017 with evidence of L5-S1 disc space desiccation loss of height posterior bulging without foraminal or central compression.  No SI joint problems.  Just the lab work brought with patient and reviewed.  These labs are unremarkable.  Density showed spine normal and hip rating osteopenia range.  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  Chronic foot burning and painful midfoot forefoot.  14 point review of systems otherwise negative as it pertains to HPI.   Objective: Vital Signs: BP (!) 150/101   Pulse 77   Ht 5\' 5"  (1.651 m)   Wt 196 lb (88.9 kg)   BMI 32.62 kg/m   Physical Exam  Constitutional: She is oriented to person, place, and time. She appears well-developed.  HENT:  Head: Normocephalic.  Right Ear: External ear normal.  Left Ear: External ear normal.  Eyes: Pupils are equal, round, and reactive to light.  Neck: No tracheal  deviation present. No thyromegaly present.  Cardiovascular: Normal rate.  Pulmonary/Chest: Effort normal.  Abdominal: Soft.  Neurological: She is alert and oriented to person, place, and time.  Skin: Skin is warm and dry.  Psychiatric: She has a normal mood and affect. Her behavior is normal.    Ortho Exam negative logroll to the hips mild sciatic notch tenderness and bilateral trochanteric bursal tenderness right and left.  Right knee medial joint line tenderness possible small Baker's cyst right knee negative on the left mild crepitus with knee extension collateral cruciate ligament exam is normal.  Slight prominence midfoot dorsal osteophyte tarsometatarsal joint right and left mildly tender.  Slight decreased first MTP range of motion without swelling.  Some tenderness between second third metatarsal heads bilateral and symmetrical.  Plantar surface of feet are normal peroneals ankle range of motion subtalar motion posterior tibial function peroneals are normal.  Normal heel toe gait.  Hip flexion strength is good Corky Sox test and other SI joint tests are negative.  Graph knee and ankle jerk are 2+ and symmetrical.  Dorsal foot lateral foot plantar foot sensation is intact.  Calf compression test negative Homan.  Specialty Comments:  No specialty comments available.  Imaging: No results found.   PMFS History: Patient Active Problem List   Diagnosis Date Noted  . Osteopenia 06/10/2016  . Sciatica 04/25/2015  . Obesity 04/25/2015  . Cervical radiculopathy 02/27/2013  . Sleep apnea   . TMJ (temporomandibular joint syndrome)    Past Medical History:  Diagnosis Date  . Lumbar vertebral fracture (HCC)    L1 from a fall.  . Menopause   . Neuropathy    seen spine specialist  - no symptoms of neuropathy  . Sleep apnea 2010   CPAP   . TMJ (temporomandibular joint syndrome)     Family History  Problem Relation Age of Onset  . Heart disease Mother        CHF  . Cancer Father         prostate  . Heart disease Father   . Hypertension Father   . Neuropathy Brother   . Irregular heart beat Brother   . Arthritis Brother        rhemotoid and seudo gout  . Neuropathy Brother   . Arrhythmia Brother   . Neuropathy Brother   . Neuropathy Brother   . Neuropathy Brother   . Kidney Stones Son   . Diabetes Maternal Grandmother     Past Surgical History:  Procedure Laterality Date  . DILATION AND CURETTAGE OF UTERUS  twice  - last was 1994  . TONSILLECTOMY     Social History   Occupational History  . Not on file  Tobacco Use  . Smoking status: Never Smoker  . Smokeless tobacco: Never Used  Substance and Sexual Activity  . Alcohol  use: No  . Drug use: No  . Sexual activity: Yes

## 2017-10-14 ENCOUNTER — Encounter: Payer: Self-pay | Admitting: Neurology

## 2017-10-17 ENCOUNTER — Other Ambulatory Visit: Payer: Self-pay | Admitting: *Deleted

## 2017-10-17 DIAGNOSIS — M79671 Pain in right foot: Secondary | ICD-10-CM

## 2017-10-17 DIAGNOSIS — M79672 Pain in left foot: Principal | ICD-10-CM

## 2017-10-27 ENCOUNTER — Ambulatory Visit (INDEPENDENT_AMBULATORY_CARE_PROVIDER_SITE_OTHER): Payer: Medicare Other | Admitting: Neurology

## 2017-10-27 DIAGNOSIS — M79672 Pain in left foot: Secondary | ICD-10-CM

## 2017-10-27 DIAGNOSIS — M79671 Pain in right foot: Secondary | ICD-10-CM | POA: Diagnosis not present

## 2017-10-27 NOTE — Procedures (Signed)
[facility]  [place], [address]  [place], [place] [address] Tel: [phone] Fax:  [phone] Test Date:  10/27/2017  Patient: Kristen Becker DOB: December 05, 1950 Physician: Narda Amber, DO  Sex: Female Height: 5\' 5"  Ref Phys: Rodell Perna, MD  ID#: [id number] Temp: 34.8C Technician:    Patient Complaints: This is a 67 year-old female referred for evaluation of low back and bilateral feet pain.  NCV & EMG Findings: Electrodiagnostic testing of the right lower extremity and additional studies of the left shows: 1. Bilateral sural and superficial peroneal sensory responses are within normal limits. 2. Bilateral peroneal and tibial motor responses are within normal limits. 3. Bilateral tibial H-reflex studies are within normal limits. 4. There is no evidence of active or chronic motor axonal changes affecting any of the tested muscles.  Motor unit configuration and recruitment pattern is within normal limits.   Impression: This is a normal study of the lower extremities.  In particular, there is no evidence of a large fiber sensorimotor polyneuropathy or lumbosacral radiculopathy.     ___________________________ Narda Amber, DO    Nerve Conduction Studies Anti Sensory Summary Table   Stim Site NR Peak (ms) Norm Peak (ms) P-T Amp (V) Norm P-T Amp  Left Sup Peroneal Anti Sensory (Ant Lat Mall)  34.8C  12 cm    2.9 <4.6 13.2 >3  Right Sup Peroneal Anti Sensory (Ant Lat Mall)  34.8C  12 cm    2.3 <4.6 14.6 >3  Left Sural Anti Sensory (Lat Mall)  34.8C  Calf    3.0 <4.6 14.7 >3  Right Sural Anti Sensory (Lat Mall)  34.8C  Calf    2.8 <4.6 16.5 >3   Motor Summary Table   Stim Site NR Onset (ms) Norm Onset (ms) O-P Amp (mV) Norm O-P Amp Site1 Site2 Delta-0 (ms) Dist (cm) Vel (m/s) Norm Vel (m/s)  Left Peroneal Motor (Ext Dig Brev)  34.8C  Ankle    2.9 <6.0 3.8 >2.5 B Fib Ankle 7.9 38.0 48 >40  B Fib    10.8  3.2  Poplt B Fib 1.2 8.0 67 >40  Poplt    12.0   2.9         Right Peroneal Motor (Ext Dig Brev)  34.8C  Ankle    2.6 <6.0 4.1 >2.5 B Fib Ankle 7.6 40.0 53 >40  B Fib    10.2  3.4  Poplt B Fib 1.3 8.0 62 >40  Poplt    11.5  3.5         Left Tibial Motor (Abd Hall Brev)  34.8C  Ankle    3.8 <6.0 11.3 >4 Knee Ankle 8.3 44.0 53 >40  Knee    12.1  7.1         Right Tibial Motor (Abd Hall Brev)  34.8C  Ankle    2.6 <6.0 10.0 >4 Knee Ankle 9.0 42.0 47 >40  Knee    11.6  6.6          H Reflex Studies   NR H-Lat (ms) Lat Norm (ms) L-R H-Lat (ms)  Left Tibial (Gastroc)  34.8C     33.61 <35 0.00  Right Tibial (Gastroc)  34.8C     33.61 <35 0.00   EMG   Side Muscle Ins Act Fibs Psw Fasc Number Recrt Dur Dur. Amp Amp. Poly Poly. Comment  Left AntTibialis Nml Nml Nml Nml Nml Nml Nml Nml Nml Nml Nml Nml N/A  Left Gastroc Nml  Nml Nml Nml Nml Nml Nml Nml Nml Nml Nml Nml N/A  Left Flex Dig Long Nml Nml Nml Nml Nml Nml Nml Nml Nml Nml Nml Nml N/A  Left RectFemoris Nml Nml Nml Nml Nml Nml Nml Nml Nml Nml Nml Nml N/A  Left GluteusMed Nml Nml Nml Nml Nml Nml Nml Nml Nml Nml Nml Nml N/A  Right AntTibialis Nml Nml Nml Nml Nml Nml Nml Nml Nml Nml Nml Nml N/A  Right Gastroc Nml Nml Nml Nml Nml Nml Nml Nml Nml Nml Nml Nml N/A  Right Flex Dig Long Nml Nml Nml Nml Nml Nml Nml Nml Nml Nml Nml Nml N/A  Right RectFemoris Nml Nml Nml Nml Nml Nml Nml Nml Nml Nml Nml Nml N/A  Right GluteusMed Nml Nml Nml Nml Nml Nml Nml Nml Nml Nml Nml Nml N/A      Waveforms:

## 2017-11-10 ENCOUNTER — Ambulatory Visit (INDEPENDENT_AMBULATORY_CARE_PROVIDER_SITE_OTHER): Payer: Medicare Other | Admitting: Orthopaedic Surgery

## 2017-11-17 ENCOUNTER — Ambulatory Visit (INDEPENDENT_AMBULATORY_CARE_PROVIDER_SITE_OTHER): Payer: Medicare Other | Admitting: Orthopaedic Surgery

## 2017-11-17 ENCOUNTER — Encounter (INDEPENDENT_AMBULATORY_CARE_PROVIDER_SITE_OTHER): Payer: Self-pay | Admitting: Orthopaedic Surgery

## 2017-11-17 VITALS — BP 148/85 | HR 63 | Ht 65.0 in | Wt 196.0 lb

## 2017-11-17 DIAGNOSIS — M5136 Other intervertebral disc degeneration, lumbar region: Secondary | ICD-10-CM

## 2017-11-17 NOTE — Progress Notes (Signed)
Office Visit Note   Patient: Kristen Becker           Date of Birth: 1950/05/27           MRN: [id number] Visit Date: 11/17/2017              Requested by: Worthy Rancher, MD [place], [place] [address] PCP: Dettinger, Fransisca Kaufmann, MD   Assessment & Plan: Visit Diagnoses:  1. Other intervertebral disc degeneration, lumbar region     Plan: Electrical tests are normal no evidence of neuropathy or radiculopathy.  She has some L5-S1 disc degeneration likely with some intermittent bulging.  She is better on B12 orally.  She will continue to work on a walking program work on losing some weight.  If she gets increased symptoms she can call and we can order an MRI scan lumbar spine for comparison to the previous scan 2017.  Currently she is doing better and will work on [employer] as outlined.  Follow-Up Instructions: No follow-ups on file.   Orders:  No orders of the defined types were placed in this encounter.  No orders of the defined types were placed in this encounter.     Procedures: No procedures performed   Clinical Data: No additional findings.   Subjective: Chief Complaint  Patient presents with  . Lower Back - Pain, Follow-up    Post EMG Bilat LE    HPI 67 year old female returns with ongoing problems with her back.  She is had previous MRI scan that showed some 5 S1 degenerative changes from MRI several years ago 2017.  She also has some cervical spondylosis problems.  Her pain is primarily been in her back on the right side.  We discussed problems with neuropathy etc. an EMG nerve conduction velocities have been obtained in the lower extremity which are normal.  She states she got some B12 over-the-counter has been taking and states she feels much better.  She still has the back symptoms that she has still occasionally having pain on the right side but states she is walking better has less aching than she is had and is improved her mobility in the  community.  Review of Systems systems updated unchanged from 10/13/2017 office visit other than as mentioned in HPI.   Objective: Vital Signs: BP (!) 148/85   Pulse 63   Ht 5\' 5"  (1.651 m)   Wt 196 lb (88.9 kg)   BMI 32.62 kg/m    Physical Exam  Constitutional: She is oriented to person, place, and time. She appears well-developed.  HENT:  Head: Normocephalic.  Right Ear: External ear normal.  Left Ear: External ear normal.  Eyes: Pupils are equal, round, and reactive to light.  Neck: No tracheal deviation present. No thyromegaly present.  Cardiovascular: Normal rate.  Pulmonary/Chest: Effort normal.  Abdominal: Soft.  Neurological: She is alert and oriented to person, place, and time.  Skin: Skin is warm and dry.  Psychiatric: She has a normal mood and affect. Her behavior is normal.    Ortho Exam Patient has normal heel toe gait.  Still has some mild sciatic notch tenderness on the right side no pitting edema area capillary refill is normal.  There are logroll the hips.  Specialty Comments:  No specialty comments available.  Imaging: No results found.   PMFS History: Patient Active Problem List   Diagnosis Date Noted  . Osteopenia 06/10/2016  . Sciatica 04/25/2015  . Obesity 04/25/2015  . Cervical  radiculopathy 02/27/2013  . Sleep apnea   . TMJ (temporomandibular joint syndrome)    Past Medical History:  Diagnosis Date  . Lumbar vertebral fracture (HCC)    L1 from a fall.  . Menopause   . Neuropathy    seen spine specialist  - no symptoms of neuropathy  . Sleep apnea 2010   CPAP   . TMJ (temporomandibular joint syndrome)     Family History  Problem Relation Age of Onset  . Heart disease Mother        CHF  . Cancer Father        prostate  . Heart disease Father   . Hypertension Father   . Neuropathy Brother   . Irregular heart beat Brother   . Arthritis Brother        rhemotoid and seudo gout  . Neuropathy Brother   . Arrhythmia Brother   .  Neuropathy Brother   . Neuropathy Brother   . Neuropathy Brother   . Kidney Stones Son   . Diabetes Maternal Grandmother     Past Surgical History:  Procedure Laterality Date  . DILATION AND CURETTAGE OF UTERUS  twice  - last was 1994  . TONSILLECTOMY     Social History   Occupational History  . Not on file  Tobacco Use  . Smoking status: Never Smoker  . Smokeless tobacco: Never Used  Substance and Sexual Activity  . Alcohol use: No  . Drug use: No  . Sexual activity: Yes

## 2018-01-18 ENCOUNTER — Ambulatory Visit (INDEPENDENT_AMBULATORY_CARE_PROVIDER_SITE_OTHER): Payer: Medicare Other | Admitting: *Deleted

## 2018-01-18 DIAGNOSIS — Z23 Encounter for immunization: Secondary | ICD-10-CM

## 2018-01-27 ENCOUNTER — Encounter: Payer: Medicare Other | Admitting: *Deleted

## 2018-02-13 DIAGNOSIS — R3989 Other symptoms and signs involving the genitourinary system: Secondary | ICD-10-CM | POA: Diagnosis not present

## 2018-02-13 DIAGNOSIS — N390 Urinary tract infection, site not specified: Secondary | ICD-10-CM | POA: Diagnosis not present

## 2018-03-09 ENCOUNTER — Encounter (INDEPENDENT_AMBULATORY_CARE_PROVIDER_SITE_OTHER): Payer: Self-pay | Admitting: Orthopaedic Surgery

## 2018-03-09 ENCOUNTER — Ambulatory Visit (INDEPENDENT_AMBULATORY_CARE_PROVIDER_SITE_OTHER): Payer: Medicare Other | Admitting: Orthopaedic Surgery

## 2018-03-09 ENCOUNTER — Other Ambulatory Visit: Payer: Medicare Other

## 2018-03-09 ENCOUNTER — Other Ambulatory Visit (INDEPENDENT_AMBULATORY_CARE_PROVIDER_SITE_OTHER): Payer: Self-pay | Admitting: Orthopaedic Surgery

## 2018-03-09 VITALS — BP 127/85 | HR 81 | Ht 65.0 in | Wt 196.0 lb

## 2018-03-09 DIAGNOSIS — M2550 Pain in unspecified joint: Secondary | ICD-10-CM | POA: Diagnosis not present

## 2018-03-09 DIAGNOSIS — M65969 Unspecified synovitis and tenosynovitis, unspecified lower leg: Secondary | ICD-10-CM

## 2018-03-09 DIAGNOSIS — M659 Synovitis and tenosynovitis, unspecified: Secondary | ICD-10-CM | POA: Diagnosis not present

## 2018-03-09 NOTE — Progress Notes (Signed)
Office Visit Note   Patient: Kristen Becker           Date of Birth: 1950-11-11           MRN: [id number] Visit Date: 03/09/2018              Requested by: Worthy Rancher, MD [place], [place] [id number] PCP: Dettinger, Fransisca Kaufmann, MD   Assessment & Plan: Visit Diagnoses:  1. Synovitis of knee     Plan: Intra-articular injection performed which gave her good relief.  We will see how she does with this she has persistent symptoms we may need to consider MRI imaging of her knee to rule out meniscal tear.  She was able to walk much better postinjection.  Follow-Up Instructions: No follow-ups on file.   Orders:  Orders Placed This Encounter  Procedures  . Large Joint Inj: R knee   No orders of the defined types were placed in this encounter.     Procedures: Large Joint Inj: R knee on 03/09/2018 2:33 PM Indications: pain and joint swelling Details: 22 G 1.5 in needle, anterolateral approach  Arthrogram: No  Medications: 40 mg methylPREDNISolone acetate 40 MG/ML; 0.5 mL lidocaine 1 %; 4 mL bupivacaine 0.25 % Outcome: tolerated well, no immediate complications Procedure, treatment alternatives, risks and benefits explained, specific risks discussed. Consent was given by the patient. Immediately prior to procedure a time out was called to verify the correct patient, procedure, equipment, support staff and site/side marked as required. Patient was prepped and draped in the usual sterile fashion.       Clinical Data: No additional findings.   Subjective: Chief Complaint  Patient presents with  . Right Knee - Pain    HPI 68 year old female returns with increased pain in her right knee.  She states it significantly worse bothered her starting a few weeks ago with throbbing pain difficulty walking she got slight relief with Aleve.  Patient states she is unable to sleep has trouble moving side to side has significant problems going up and down stairs.  She noted  some swelling behind her knee pain radiates down from the knee partially to the ankle.  She has used ice and heat.  No true locking.  She describes some catching.  Previous x-rays showed minimal medial joint line narrowing.  Review of Systems 14 point update unchanged from 10/13/2017 office visit other than as mentioned in HPI.   Objective: Vital Signs: BP 127/85   Pulse 81   Ht 5\' 5"  (1.651 m)   Wt 196 lb (88.9 kg)   BMI 32.62 kg/m   Physical Exam Constitutional:      Appearance: She is well-developed.  HENT:     Head: Normocephalic.     Right Ear: External ear normal.     Left Ear: External ear normal.  Eyes:     Pupils: Pupils are equal, round, and reactive to light.  Neck:     Thyroid: No thyromegaly.     Trachea: No tracheal deviation.  Cardiovascular:     Rate and Rhythm: Normal rate.  Pulmonary:     Effort: Pulmonary effort is normal.  Abdominal:     Palpations: Abdomen is soft.  Skin:    General: Skin is warm and dry.  Neurological:     Mental Status: She is alert and oriented to person, place, and time.  Psychiatric:        Behavior: Behavior normal.     Ortho Exam  patient has mild effusion medial joint line tenderness more than lateral collateral ligaments crucial ligament exam is normal no increased warmth pes bursa is normal normal hip range of motion negative straight leg raising no palpable Baker's cyst.  Specialty Comments:  No specialty comments available.  Imaging: No results found.   PMFS History: Patient Active Problem List   Diagnosis Date Noted  . Osteopenia 06/10/2016  . Sciatica 04/25/2015  . Obesity 04/25/2015  . Cervical radiculopathy 02/27/2013  . Sleep apnea   . TMJ (temporomandibular joint syndrome)    Past Medical History:  Diagnosis Date  . Lumbar vertebral fracture (HCC)    L1 from a fall.  . Menopause   . Neuropathy    seen spine specialist  - no symptoms of neuropathy  . Sleep apnea 2010   CPAP   . TMJ  (temporomandibular joint syndrome)     Family History  Problem Relation Age of Onset  . Heart disease Mother        CHF  . Cancer Father        prostate  . Heart disease Father   . Hypertension Father   . Neuropathy Brother   . Irregular heart beat Brother   . Arthritis Brother        rhemotoid and seudo gout  . Neuropathy Brother   . Arrhythmia Brother   . Neuropathy Brother   . Neuropathy Brother   . Neuropathy Brother   . Kidney Stones Son   . Diabetes Maternal Grandmother     Past Surgical History:  Procedure Laterality Date  . DILATION AND CURETTAGE OF UTERUS  twice  - last was 1994  . TONSILLECTOMY     Social History   Occupational History  . Not on file  Tobacco Use  . Smoking status: Never Smoker  . Smokeless tobacco: Never Used  Substance and Sexual Activity  . Alcohol use: No  . Drug use: No  . Sexual activity: Yes

## 2018-03-10 ENCOUNTER — Encounter (INDEPENDENT_AMBULATORY_CARE_PROVIDER_SITE_OTHER): Payer: Self-pay | Admitting: Orthopaedic Surgery

## 2018-03-10 LAB — CBC WITH DIFFERENTIAL/PLATELET
BASOS ABS: 0.1 10*3/uL (ref 0.0–0.2)
BASOS: 1 %
EOS (ABSOLUTE): 0.1 10*3/uL (ref 0.0–0.4)
Eos: 2 %
HEMATOCRIT: 40.9 % (ref 34.0–46.6)
Hemoglobin: 14.1 g/dL (ref 11.1–15.9)
IMMATURE GRANS (ABS): 0 10*3/uL (ref 0.0–0.1)
IMMATURE GRANULOCYTES: 0 %
LYMPHS: 34 %
Lymphocytes Absolute: 2 10*3/uL (ref 0.7–3.1)
MCH: 30.7 pg (ref 26.6–33.0)
MCHC: 34.5 g/dL (ref 31.5–35.7)
MCV: 89 fL (ref 79–97)
MONOS ABS: 0.4 10*3/uL (ref 0.1–0.9)
Monocytes: 7 %
NEUTROS PCT: 56 %
Neutrophils Absolute: 3.3 10*3/uL (ref 1.4–7.0)
PLATELETS: 134 10*3/uL — AB (ref 150–450)
RBC: 4.59 x10E6/uL (ref 3.77–5.28)
RDW: 12.6 % (ref 11.7–15.4)
WBC: 5.8 10*3/uL (ref 3.4–10.8)

## 2018-03-10 LAB — ANA: ANA TITER 1: NEGATIVE

## 2018-03-10 LAB — C-REACTIVE PROTEIN: CRP: 3 mg/L (ref 0–10)

## 2018-03-10 LAB — URIC ACID: Uric Acid: 5.3 mg/dL (ref 2.5–7.1)

## 2018-03-10 LAB — SEDIMENTATION RATE: Sed Rate: 7 mm/hr (ref 0–40)

## 2018-03-10 LAB — RHEUMATOID FACTOR: Rheumatoid fact SerPl-aCnc: <10 IU/mL (ref 0.0–13.9)

## 2018-03-10 MED ORDER — LIDOCAINE HCL 1 % IJ SOLN
0.5000 mL | INTRAMUSCULAR | Status: AC | PRN
  Administered 2018-03-09: .5 mL

## 2018-03-10 MED ORDER — METHYLPREDNISOLONE ACETATE 40 MG/ML IJ SUSP
40.0000 mg | INTRAMUSCULAR | Status: AC | PRN
  Administered 2018-03-09: 40 mg via INTRA_ARTICULAR

## 2018-03-10 MED ORDER — BUPIVACAINE HCL 0.25 % IJ SOLN
4.0000 mL | INTRAMUSCULAR | Status: AC | PRN
  Administered 2018-03-09: 4 mL via INTRA_ARTICULAR

## 2018-04-04 ENCOUNTER — Telehealth: Payer: Self-pay | Admitting: Family Medicine

## 2018-04-06 ENCOUNTER — Ambulatory Visit (INDEPENDENT_AMBULATORY_CARE_PROVIDER_SITE_OTHER): Payer: Medicare Other | Admitting: Orthopaedic Surgery

## 2018-04-06 ENCOUNTER — Encounter (INDEPENDENT_AMBULATORY_CARE_PROVIDER_SITE_OTHER): Payer: Self-pay | Admitting: Orthopaedic Surgery

## 2018-04-06 VITALS — BP 121/73 | HR 72 | Ht 65.0 in | Wt 198.0 lb

## 2018-04-06 DIAGNOSIS — G8929 Other chronic pain: Secondary | ICD-10-CM

## 2018-04-06 DIAGNOSIS — M25561 Pain in right knee: Secondary | ICD-10-CM | POA: Diagnosis not present

## 2018-04-06 NOTE — Progress Notes (Signed)
Office Visit Note   Patient: Kristen Becker           Date of Birth: 1950-02-21           MRN: [id number] Visit Date: 04/06/2018              Requested by: Worthy Rancher, MD [place], [place] [address] PCP: Dettinger, Fransisca Kaufmann, MD   Assessment & Plan: Visit Diagnoses:  1. Chronic pain of right knee     Plan: Patient had persistent symptoms right knee with pain limping wakes her up at night difficulty with ambulation she is failed anti-inflammatories and intra-articular injection with persistent medial joint line pain.  Concerned she may fall and at times when she stands up she has had locking.  We will proceed with MRI scan to rule out medial osteochondral injury versus medial meniscal tear right knee.  Office follow-up after scan.  Follow-Up Instructions: No follow-ups on file.   Orders:  Orders Placed This Encounter  Procedures  . MR Knee Right w/o contrast   No orders of the defined types were placed in this encounter.     Procedures: No procedures performed   Clinical Data: No additional findings.   Subjective: Chief Complaint  Patient presents with  . Right Knee - Pain    HPI 68 year old female returns post right knee injection 03/09/2018.  She states she is walking a little bit better but still has significant limp her knee wakes her up at night.  She has difficulty getting out of the car times when she stands up she cannot walk for 20 to 30 seconds and at standstill.  She has significant pain along the medial joint line and pain radiates down to the tibial medial metaphyseal region.  There that is been a problem is been her lumbar spine.  Past history of injections.  She had L5-S1 disc degeneration with narrowing, spurring, disc desiccation L4-5 and   L5-S1.  MRI 2019 compared to 2017 showed progression of disc bulge at L5-S1 on the right consistent with her symptoms.  Is giving her significantly more problems in her back which is been a constant  problem for a few years.  No fever chills no bowel bladder symptoms.  Review of Systems 14 point systems updated unchanged from 10/13/2017 other than as mentioned above with right knee problems more severe than back and right leg pain.   Objective: Vital Signs: BP 121/73   Pulse 72   Ht 5\' 5"  (1.651 m)   Wt 198 lb (89.8 kg)   BMI 32.95 kg/m   Physical Exam Constitutional:      Appearance: She is well-developed.  HENT:     Head: Normocephalic.     Right Ear: External ear normal.     Left Ear: External ear normal.  Eyes:     Pupils: Pupils are equal, round, and reactive to light.  Neck:     Thyroid: No thyromegaly.     Trachea: No tracheal deviation.  Cardiovascular:     Rate and Rhythm: Normal rate.  Pulmonary:     Effort: Pulmonary effort is normal.  Abdominal:     Palpations: Abdomen is soft.  Skin:    General: Skin is warm and dry.  Neurological:     Mental Status: She is alert and oriented to person, place, and time.  Psychiatric:        Behavior: Behavior normal.     Ortho Exam patient is amatory with a  right knee limp.  She is ambulating better than 03/09/2018.  She has significant medial joint line tenderness.  Collateral ligaments are stable ACL PCL exam is normal negative logroll to the hips.  She has mild sciatic notch tenderness on the right anterior tib gastrocsoleus is intact.  Passively knee reaches full extension she flexes 110 degrees.  When she ambulates she ambulates with a slight knee flexed gait on the right.  Left knee shows full range of motion no tenderness.  Specialty Comments:  No specialty comments available.  Imaging: No results found.   PMFS History: Patient Active Problem List   Diagnosis Date Noted  . Osteopenia 06/10/2016  . Sciatica 04/25/2015  . Obesity 04/25/2015  . Cervical radiculopathy 02/27/2013  . Sleep apnea   . TMJ (temporomandibular joint syndrome)    Past Medical History:  Diagnosis Date  . Lumbar vertebral fracture  (HCC)    L1 from a fall.  . Menopause   . Neuropathy    seen spine specialist  - no symptoms of neuropathy  . Sleep apnea 2010   CPAP   . TMJ (temporomandibular joint syndrome)     Family History  Problem Relation Age of Onset  . Heart disease Mother        CHF  . Cancer Father        prostate  . Heart disease Father   . Hypertension Father   . Neuropathy Brother   . Irregular heart beat Brother   . Arthritis Brother        rhemotoid and seudo gout  . Neuropathy Brother   . Arrhythmia Brother   . Neuropathy Brother   . Neuropathy Brother   . Neuropathy Brother   . Kidney Stones Son   . Diabetes Maternal Grandmother     Past Surgical History:  Procedure Laterality Date  . DILATION AND CURETTAGE OF UTERUS  twice  - last was 1994  . TONSILLECTOMY     Social History   Occupational History  . Not on file  Tobacco Use  . Smoking status: Never Smoker  . Smokeless tobacco: Never Used  Substance and Sexual Activity  . Alcohol use: No  . Drug use: No  . Sexual activity: Yes

## 2018-04-11 ENCOUNTER — Encounter: Payer: Self-pay | Admitting: Orthopaedic Surgery

## 2018-04-11 DIAGNOSIS — M7121 Synovial cyst of popliteal space [Baker], right knee: Secondary | ICD-10-CM | POA: Diagnosis not present

## 2018-04-11 DIAGNOSIS — M25561 Pain in right knee: Secondary | ICD-10-CM | POA: Diagnosis not present

## 2018-04-11 DIAGNOSIS — S83231A Complex tear of medial meniscus, current injury, right knee, initial encounter: Secondary | ICD-10-CM | POA: Diagnosis not present

## 2018-04-13 ENCOUNTER — Ambulatory Visit (INDEPENDENT_AMBULATORY_CARE_PROVIDER_SITE_OTHER): Payer: Medicare Other | Admitting: Orthopaedic Surgery

## 2018-04-13 ENCOUNTER — Encounter (INDEPENDENT_AMBULATORY_CARE_PROVIDER_SITE_OTHER): Payer: Self-pay | Admitting: Orthopaedic Surgery

## 2018-04-13 VITALS — BP 137/89 | HR 84 | Ht 65.0 in | Wt 198.0 lb

## 2018-04-13 DIAGNOSIS — S83241D Other tear of medial meniscus, current injury, right knee, subsequent encounter: Secondary | ICD-10-CM | POA: Diagnosis not present

## 2018-04-15 ENCOUNTER — Encounter (INDEPENDENT_AMBULATORY_CARE_PROVIDER_SITE_OTHER): Payer: Self-pay | Admitting: Orthopaedic Surgery

## 2018-04-15 DIAGNOSIS — S83241A Other tear of medial meniscus, current injury, right knee, initial encounter: Secondary | ICD-10-CM | POA: Insufficient documentation

## 2018-04-15 NOTE — Progress Notes (Addendum)
Office Visit Note   Patient: Kristen Becker           Date of Birth: Jul 26, 1950           MRN: [id number] Visit Date: 04/13/2018              Requested by: Worthy Rancher, MD [place], [place] [address] PCP: Dettinger, Fransisca Kaufmann, MD   Assessment & Plan: Visit Diagnoses:  1. Acute medial meniscus tear of right knee, subsequent encounter     Plan: Patient's having persistent symptoms with meniscal tear mechanical locking she has had to use a cane she has had anti-inflammatories intra-articular injection ambulatory with a limp has pain at night.  X-rays show mild changes and lateral compartment is normal.  Plan will be knee arthroscopy for partial meniscectomy.  Procedure discussed questions were elicited and answered she understands request to proceed.  TED hose were supplies knee-high for the swelling she is having in her feet and ankles.  Follow-Up Instructions: No follow-ups on file.   Orders:  No orders of the defined types were placed in this encounter.  No orders of the defined types were placed in this encounter.     Procedures: No procedures performed   Clinical Data: No additional findings.   Subjective: Chief Complaint  Patient presents with  . Right Knee - Follow-up    MRI Right Knee Review    HPI 68 year old female returns with ongoing problems with right knee pain with limping pain medially difficulty sleeping.  She is taken anti-inflammatories had intra-articular injections without relief.  She states gradually is getting worse and at times when she stands her knee is been locking and she points to the medial joint line posterior to the medial collateral ligament where she feels sharp pain when it occurs.  She is used a cane in some instances.  She is noted swelling in her knee recurrent no problems with her opposite left knee no history of gout no fever chills.  Review of Systems 14 point systems updated unchanged from 9/519 other than as  mentioned above with right knee progressive problems.   Objective: Vital Signs: BP 137/89   Pulse 84   Ht 5\' 5"  (1.651 m)   Wt 198 lb (89.8 kg)   BMI 32.95 kg/m   Physical Exam Constitutional:      Appearance: She is well-developed.  HENT:     Head: Normocephalic.     Right Ear: External ear normal.     Left Ear: External ear normal.  Eyes:     Pupils: Pupils are equal, round, and reactive to light.  Neck:     Thyroid: No thyromegaly.     Trachea: No tracheal deviation.  Cardiovascular:     Rate and Rhythm: Normal rate.  Pulmonary:     Effort: Pulmonary effort is normal.  Abdominal:     Palpations: Abdomen is soft.  Skin:    General: Skin is warm and dry.  Neurological:     Mental Status: She is alert and oriented to person, place, and time.  Psychiatric:        Behavior: Behavior normal.     Ortho Exam patient has exquisite pain with hyperextension and pressure.  Tenderness posterior medial joint line no tenderness laterally 2+ knee effusion.  With sitting standing on and off the exam table she had one episode where her knee locked and with juggling her knee she is able to extend it.  Distal pulses are  intact right and left.  No pain with hip range of motion no sciatic notch tenderness.  She is amatory with a right knee limp.  She has full extension flexes to 120 degrees.  Mild trace pitting edema right and left legs which are symmetrical.  Left knee has full range of motion no pain no locking.  Specialty Comments:  No specialty comments available.  Imaging: Right knee MRI read by Dr. Inge Rise on 04/11/2018 showed complex tear posterior horn medial meniscus with degenerative changes horizontal tear reaching the meniscal undersurface.  Lateral meniscus was intact.  PMFS History: Patient Active Problem List   Diagnosis Date Noted  . Osteopenia 06/10/2016  . Sciatica 04/25/2015  . Obesity 04/25/2015  . Cervical radiculopathy 02/27/2013  . Sleep apnea   . TMJ  (temporomandibular joint syndrome)    Past Medical History:  Diagnosis Date  . Lumbar vertebral fracture (HCC)    L1 from a fall.  . Menopause   . Neuropathy    seen spine specialist  - no symptoms of neuropathy  . Sleep apnea 2010   CPAP   . TMJ (temporomandibular joint syndrome)     Family History  Problem Relation Age of Onset  . Heart disease Mother        CHF  . Cancer Father        prostate  . Heart disease Father   . Hypertension Father   . Neuropathy Brother   . Irregular heart beat Brother   . Arthritis Brother        rhemotoid and seudo gout  . Neuropathy Brother   . Arrhythmia Brother   . Neuropathy Brother   . Neuropathy Brother   . Neuropathy Brother   . Kidney Stones Son   . Diabetes Maternal Grandmother     Past Surgical History:  Procedure Laterality Date  . DILATION AND CURETTAGE OF UTERUS  twice  - last was 1994  . TONSILLECTOMY     Social History   Occupational History  . Not on file  Tobacco Use  . Smoking status: Never Smoker  . Smokeless tobacco: Never Used  Substance and Sexual Activity  . Alcohol use: No  . Drug use: No  . Sexual activity: Yes

## 2018-04-21 ENCOUNTER — Telehealth (INDEPENDENT_AMBULATORY_CARE_PROVIDER_SITE_OTHER): Payer: Self-pay | Admitting: Orthopaedic Surgery

## 2018-04-21 NOTE — Telephone Encounter (Signed)
FYI:    Patient scheduled for right knee arthroscopy w/partial medial meniscectomy at [facility] on 05-08-18.  Post op set for 05-18-18 at [facility] office

## 2018-04-21 NOTE — Telephone Encounter (Signed)
noted 

## 2018-04-25 DIAGNOSIS — Z803 Family history of malignant neoplasm of breast: Secondary | ICD-10-CM | POA: Diagnosis not present

## 2018-04-25 DIAGNOSIS — Z1231 Encounter for screening mammogram for malignant neoplasm of breast: Secondary | ICD-10-CM | POA: Diagnosis not present

## 2018-04-25 DIAGNOSIS — N952 Postmenopausal atrophic vaginitis: Secondary | ICD-10-CM | POA: Diagnosis not present

## 2018-04-25 DIAGNOSIS — Z124 Encounter for screening for malignant neoplasm of cervix: Secondary | ICD-10-CM | POA: Diagnosis not present

## 2018-11-29 DIAGNOSIS — Z23 Encounter for immunization: Secondary | ICD-10-CM | POA: Diagnosis not present

## 2019-03-12 DIAGNOSIS — Z23 Encounter for immunization: Secondary | ICD-10-CM | POA: Diagnosis not present

## 2019-04-03 DIAGNOSIS — H04123 Dry eye syndrome of bilateral lacrimal glands: Secondary | ICD-10-CM | POA: Diagnosis not present

## 2019-04-03 DIAGNOSIS — H43813 Vitreous degeneration, bilateral: Secondary | ICD-10-CM | POA: Diagnosis not present

## 2019-04-09 DIAGNOSIS — Z23 Encounter for immunization: Secondary | ICD-10-CM | POA: Diagnosis not present

## 2019-06-06 DIAGNOSIS — R3989 Other symptoms and signs involving the genitourinary system: Secondary | ICD-10-CM | POA: Diagnosis not present

## 2019-06-06 DIAGNOSIS — N390 Urinary tract infection, site not specified: Secondary | ICD-10-CM | POA: Diagnosis not present

## 2019-06-15 DIAGNOSIS — Z803 Family history of malignant neoplasm of breast: Secondary | ICD-10-CM | POA: Diagnosis not present

## 2019-06-15 DIAGNOSIS — L292 Pruritus vulvae: Secondary | ICD-10-CM | POA: Diagnosis not present

## 2019-06-15 DIAGNOSIS — L304 Erythema intertrigo: Secondary | ICD-10-CM | POA: Diagnosis not present

## 2019-06-15 DIAGNOSIS — N952 Postmenopausal atrophic vaginitis: Secondary | ICD-10-CM | POA: Diagnosis not present

## 2019-06-15 DIAGNOSIS — Z1231 Encounter for screening mammogram for malignant neoplasm of breast: Secondary | ICD-10-CM | POA: Diagnosis not present

## 2019-12-08 DIAGNOSIS — Z23 Encounter for immunization: Secondary | ICD-10-CM | POA: Diagnosis not present

## 2020-02-11 ENCOUNTER — Ambulatory Visit (INDEPENDENT_AMBULATORY_CARE_PROVIDER_SITE_OTHER): Payer: Medicare Other | Admitting: *Deleted

## 2020-02-11 ENCOUNTER — Telehealth: Payer: Self-pay | Admitting: Family Medicine

## 2020-02-11 DIAGNOSIS — Z0000 Encounter for general adult medical examination without abnormal findings: Secondary | ICD-10-CM

## 2020-02-11 NOTE — Telephone Encounter (Signed)
Transferred to Kristen Becker.

## 2020-02-11 NOTE — Telephone Encounter (Signed)
Transferred to Wendy.

## 2020-02-11 NOTE — Progress Notes (Signed)
MEDICARE ANNUAL WELLNESS VISIT  02/11/2020  Telephone Visit Disclaimer This Medicare AWV was conducted by telephone due to national recommendations for restrictions regarding the COVID-19 Pandemic (e.g. social distancing).  I verified, using two identifiers, that I am speaking with Kristen Becker or their authorized healthcare agent. I discussed the limitations, risks, security, and privacy concerns of performing an evaluation and management service by telephone and the potential availability of an in-person appointment in the future. The patient expressed understanding and agreed to proceed.  Location of Patient: Home Location of Provider (nurse):  [facility]   Subjective:    Kristen Becker is a 70 y.o. female patient of Dettinger, Fransisca Kaufmann, MD who had a Medicare Annual Wellness Visit today via telephone. 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 She has 2 children. She reports that she is socially active and does interact with friends/family regularly. She is minimally physically active and enjoys shopping, fishing, sewing, going to [employer], cooking, working on the farm, and spending time with family.  Patient Care Team: Dettinger, Fransisca Kaufmann, MD as PCP - General (Family Medicine) Leroy Sea, MD as Referring Physician (Urology) Feliz Beam, MD as Referring Physician (Ophthalmology) Baird Kay, MD as Referring Physician (Specialist) Johnnette Gourd, MD (Physical Medicine and Rehabilitation)  Dr. Reesa Chew GI   Advanced Directives 02/11/2020 05/24/2016  Does Patient Have a Medical Advance Directive? No No  Would patient like information on creating a medical advance directive? Yes (MAU/Ambulatory/Procedural Areas - Information given) Yes (MAU/Ambulatory/Procedural Areas - Information given)    Hospital Utilization Over the Past 12 Months: # of hospitalizations or ER visits: 0 # of surgeries: 0  Review of Systems    Patient reports that  her overall health is better compared to last year.  History obtained from chart review and the patient Musculoskeletal ROS: positive for - pain in back - bilateral  Patient Reported Readings (BP, Pulse, CBG, Weight, etc) none  Pain Assessment       Current Medications & Allergies (verified) Allergies as of 02/11/2020      Reactions   Codeine Rash   Sulfa Antibiotics Rash      Medication List       Accurate as of February 11, 2020 12:37 PM. If you have any questions, ask your nurse or doctor.        STOP taking these medications   OVER THE COUNTER MEDICATION     TAKE these medications   Astaxanthin 4 MG Caps Take 1 tablet by mouth.   estradiol 0.1 MG/GM vaginal cream Commonly known as: ESTRACE Place 1 Applicatorful vaginally at bedtime.   Lysine 1000 MG Tabs Take by mouth.   meloxicam 15 MG tablet Commonly known as: MOBIC Take 0.5-1 tablets (7.5-15 mg total) by mouth daily. (if needed for back/ knee pain)   Methylsulfonylmethane 1000 MG Tabs Take 4 tablets by mouth daily.   PROBIOTIC ACIDOPHILUS PO Take by mouth.   trimethoprim 100 MG tablet Commonly known as: TRIMPEX Take 100 mg by mouth daily.   UNABLE TO FIND Med Name: Leptin       History (reviewed): Past Medical History:  Diagnosis Date  . Lumbar vertebral fracture (HCC)    L1 from a fall.  . Menopause   . Neuropathy    seen spine specialist  - no symptoms of neuropathy  . Sleep apnea 2010   CPAP   . TMJ (temporomandibular joint syndrome)    Past Surgical History:  Procedure Laterality Date  . DILATION AND CURETTAGE OF UTERUS  twice  - last was 1994  . TONSILLECTOMY     Family History  Problem Relation Age of Onset  . Heart disease Mother        CHF  . Cancer Father        prostate  . Heart disease Father   . Hypertension Father   . Neuropathy Brother   . Irregular heart beat Brother   . Arthritis Brother        rhemotoid and seudo gout  . Neuropathy Brother   . Arrhythmia  Brother   . Neuropathy Brother   . Neuropathy Brother   . Neuropathy Brother   . Kidney Stones Son   . Diabetes Maternal Grandmother    Social History   Socioeconomic History  . Marital status: Married    Spouse name: Not on file  . Number of children: 2  . Years of education: Not on file  . Highest education level: Not on file  Occupational History  . Not on file  Tobacco Use  . Smoking status: Never Smoker  . Smokeless tobacco: Never Used  Vaping Use  . Vaping Use: Never used  Substance and Sexual Activity  . Alcohol use: No  . Drug use: No  . Sexual activity: Yes  Other Topics Concern  . Not on file  Social History Narrative  . Not on file   Social Determinants of Health   Financial Resource Strain: Not on file  Food Insecurity: Not on file  Transportation Needs: Not on file  Physical Activity: Not on file  Stress: Not on file  Social Connections: Not on file    Activities of Daily Living In your present state of health, do you have any difficulty performing the following activities: 02/11/2020  Hearing? N  Vision? N  Comment wears glasses  Difficulty concentrating or making decisions? N  Walking or climbing stairs? Y  Comment Due to Back  Dressing or bathing? N  Doing errands, shopping? N  Preparing Food and eating ? N  Using the Toilet? N  In the past six months, have you accidently leaked urine? Y  Comment Urine leakage see's urologist  Do you have problems with loss of bowel control? N  Managing your Medications? N  Managing your Finances? N  Housekeeping or managing your Housekeeping? N  Some recent data might be hidden    Patient Education/ Literacy    Exercise Current Exercise Habits: The patient has a physically strenuous job, but has no regular exercise apart from work., Exercise limited by: orthopedic condition(s) (Back pain)  Diet Patient reports consuming 3 meals a day and 1 snack(s) a day Patient reports that her primary diet is:  Diabetic Patient reports that she does have regular access to food.   Depression Screen PHQ 2/9 Scores 02/11/2020 09/19/2017 05/09/2017 06/03/2016 05/24/2016 02/11/2016 10/06/2015  PHQ - 2 Score 0 0 0 0 0 0 0     Fall Risk Fall Risk  02/11/2020 09/19/2017 05/09/2017 06/03/2016 05/24/2016  Falls in the past year? 0 No No No Yes  Number falls in past yr: - - - - 1  Injury with Fall? - - - - Yes  Risk for fall due to : - - - - -     Objective:  Kristen Becker seemed alert and oriented and she participated appropriately during our telephone visit.  Blood Pressure Weight BMI  BP Readings from Last 3 Encounters:  04/13/18  137/89  04/06/18 121/73  03/09/18 127/85   Wt Readings from Last 3 Encounters:  04/13/18 198 lb (89.8 kg)  04/06/18 198 lb (89.8 kg)  03/09/18 196 lb (88.9 kg)   BMI Readings from Last 1 Encounters:  04/13/18 32.95 kg/m    *Unable to obtain current vital signs, weight, and BMI due to telephone visit type  Hearing/Vision  . Kristen Becker did not seem to have difficulty with hearing/understanding during the telephone conversation . Reports that she has had a formal eye exam by an eye care professional within the past year . Reports that she has not had a formal hearing evaluation within the past year *Unable to fully assess hearing and vision during telephone visit type  Cognitive Function: 6CIT Screen 02/11/2020  What Year? 0 points  What month? 0 points  What time? 0 points  Count back from 20 0 points  Months in reverse 0 points  Repeat phrase 0 points  Total Score 0   (Normal:0-7, Significant for Dysfunction: >8)  Normal Cognitive Function Screening: Yes   Immunization & Health Maintenance Record Immunization History  Administered Date(s) Administered  . Influenza, High Dose Seasonal PF 01/14/2016, 11/22/2016, 01/18/2018, 12/08/2019  . Influenza,inj,Quad PF,6+ Mos 12/05/2013  . Moderna Sars-Covid-2 Vaccination 03/12/2019, 04/09/2019  . Pneumococcal Conjugate-13  05/24/2016  . Tdap 09/25/2015  . Zoster 10/25/2013    Health Maintenance  Topic Date Due  . PNA vac Low Risk Adult (2 of 2 - PPSV23) 05/24/2017  . DEXA SCAN  06/08/2018  . COVID-19 Vaccine (3 - Booster for Moderna series) 10/10/2019  . MAMMOGRAM  04/24/2020  . TETANUS/TDAP  09/24/2025  . COLONOSCOPY (Pts 45-101yrs Insurance coverage will need to be confirmed)  06/22/2026  . INFLUENZA VACCINE  Completed  . Hepatitis C Screening  Completed       Assessment  This is a routine wellness examination for Kristen Becker.  Health Maintenance: Due or Overdue Health Maintenance Due  Topic Date Due  . PNA vac Low Risk Adult (2 of 2 - PPSV23) 05/24/2017  . DEXA SCAN  06/08/2018  . COVID-19 Vaccine (3 - Booster for Moderna series) 10/10/2019    Kristen Becker does not need a referral for Community Assistance: Care Management:   no Social Work:    no Prescription Assistance:  no Nutrition/Diabetes Education:  no   Plan:  Personalized Goals Goals Addressed            This Visit's Progress   . AWV       02/11/2020 AWV Goal: Fall Prevention  . Over the next year, patient will decrease their risk for falls by: o Using assistive devices, such as a cane or walker, as needed o Identifying fall risks within their home and correcting them by: - Removing throw rugs - Adding handrails to stairs or ramps - Removing clutter and keeping a clear pathway throughout the home - Increasing light, especially at night - Adding shower handles/bars - Raising toilet seat o Identifying potential personal risk factors for falls: - Medication side effects - Incontinence/urgency - Vestibular dysfunction - Hearing loss - Musculoskeletal disorders - Neurological disorders - Orthostatic hypotension        Personalized Health Maintenance & Screening Recommendations  Pneumococcal vaccine  Shigrix  COVID Booster  Lung Cancer Screening Recommended: no (Low Dose CT Chest recommended if Age 65-80  years, 30 pack-year currently smoking OR have quit w/in past 15 years) Hepatitis C Screening recommended: no HIV Screening recommended: no  Advanced Directives: Written information  was not prepared per patient's request.  Referrals & Orders No orders of the defined types were placed in this encounter.   Follow-up Plan . Follow-up with Dettinger, Elige Radon, MD as planned . Schedule appointment for pneumovax and shingrix  . AVS printed and mailed to patient    I have personally reviewed and noted the following in the patient's chart:   . Medical and social history . Use of alcohol, tobacco or illicit drugs  . Current medications and supplements . Functional ability and status . Nutritional status . Physical activity . Advanced directives . List of other physicians . Hospitalizations, surgeries, and ER visits in previous 12 months . Vitals . Screenings to include cognitive, depression, and falls . Referrals and appointments  In addition, I have reviewed and discussed with Kristen Becker certain preventive protocols, quality metrics, and best practice recommendations. A written personalized care plan for preventive services as well as general preventive health recommendations is available and can be mailed to the patient at her request.      Billee Cashing, LPN  06/14/1535

## 2020-02-11 NOTE — Patient Instructions (Signed)
  MEDICARE ANNUAL WELLNESS VISIT Health Maintenance Summary and Written Plan of Care  Ms. Kristen Becker ,  Thank you for allowing me to perform your Medicare Annual Wellness Visit and for your ongoing commitment to your health.   Health Maintenance & Immunization History Health Maintenance  Topic Date Due  . PNA vac Low Risk Adult (2 of 2 - PPSV23) 05/24/2017  . DEXA SCAN  06/08/2018  . COVID-19 Vaccine (3 - Booster for Moderna series) 10/10/2019  . MAMMOGRAM  04/24/2020  . TETANUS/TDAP  09/24/2025  . COLONOSCOPY (Pts 45-16yrs Insurance coverage will need to be confirmed)  06/22/2026  . INFLUENZA VACCINE  Completed  . Hepatitis C Screening  Completed   Immunization History  Administered Date(s) Administered  . Influenza, High Dose Seasonal PF 01/14/2016, 11/22/2016, 01/18/2018, 12/08/2019  . Influenza,inj,Quad PF,6+ Mos 12/05/2013  . Moderna Sars-Covid-2 Vaccination 03/12/2019, 04/09/2019  . Pneumococcal Conjugate-13 05/24/2016  . Tdap 09/25/2015  . Zoster 10/25/2013    These are the patient goals that we discussed: Goals Addressed            This Visit's Progress   . AWV       02/11/2020 AWV Goal: Fall Prevention  . Over the next year, patient will decrease their risk for falls by: o Using assistive devices, such as a cane or walker, as needed o Identifying fall risks within their home and correcting them by: - Removing throw rugs - Adding handrails to stairs or ramps - Removing clutter and keeping a clear pathway throughout the home - Increasing light, especially at night - Adding shower handles/bars - Raising toilet seat o Identifying potential personal risk factors for falls: - Medication side effects - Incontinence/urgency - Vestibular dysfunction - Hearing loss - Musculoskeletal disorders - Neurological disorders - Orthostatic hypotension          This is a list of Health Maintenance Items that are overdue or due now: Health Maintenance Due  Topic Date Due   . PNA vac Low Risk Adult (2 of 2 - PPSV23) 05/24/2017  . DEXA SCAN  06/08/2018  . COVID-19 Vaccine (3 - Booster for Moderna series) 10/10/2019     Orders/Referrals Placed Today: No orders of the defined types were placed in this encounter.  (Contact our referral department at [phone] if you have not spoken with someone about your referral appointment within the next 5 days)    Follow-up Plan . Follow-up with Dettinger, Elige Radon, MD as planned . Schedule appointment for pneumovax and shingrix

## 2020-02-11 NOTE — Telephone Encounter (Signed)
Kristen Becker 10/17/1950 is on the phone about her AWV, she said that her power had been out and that she wanted to see if anyone had called yet. Her husband also had an AWV at 10:30 for Kristen Becker 09/17/1945 but that was when the power was out

## 2020-03-05 ENCOUNTER — Ambulatory Visit (INDEPENDENT_AMBULATORY_CARE_PROVIDER_SITE_OTHER): Payer: Medicare Other

## 2020-03-05 ENCOUNTER — Encounter: Payer: Self-pay | Admitting: Family Medicine

## 2020-03-05 ENCOUNTER — Ambulatory Visit: Payer: Medicare Other

## 2020-03-05 ENCOUNTER — Ambulatory Visit (INDEPENDENT_AMBULATORY_CARE_PROVIDER_SITE_OTHER): Payer: Medicare Other | Admitting: Family Medicine

## 2020-03-05 ENCOUNTER — Other Ambulatory Visit: Payer: Self-pay

## 2020-03-05 VITALS — BP 129/65 | HR 60 | Temp 98.1°F | Ht 65.0 in | Wt 207.4 lb

## 2020-03-05 DIAGNOSIS — Z0001 Encounter for general adult medical examination with abnormal findings: Secondary | ICD-10-CM

## 2020-03-05 DIAGNOSIS — Z136 Encounter for screening for cardiovascular disorders: Secondary | ICD-10-CM | POA: Diagnosis not present

## 2020-03-05 DIAGNOSIS — Z0000 Encounter for general adult medical examination without abnormal findings: Secondary | ICD-10-CM

## 2020-03-05 DIAGNOSIS — G4733 Obstructive sleep apnea (adult) (pediatric): Secondary | ICD-10-CM | POA: Diagnosis not present

## 2020-03-05 DIAGNOSIS — Z23 Encounter for immunization: Secondary | ICD-10-CM

## 2020-03-05 DIAGNOSIS — Z780 Asymptomatic menopausal state: Secondary | ICD-10-CM

## 2020-03-05 MED ORDER — MELOXICAM 15 MG PO TABS: 7.5000 mg | ORAL_TABLET | Freq: Every day | ORAL | 5 refills | Status: DC

## 2020-03-05 NOTE — Progress Notes (Signed)
   Covid-19 Vaccination Clinic  Name:  Kristen Becker    MRN: [id number] DOB: 1950/08/04  03/05/2020  Kristen Becker was observed post Covid-19 immunization for 15 minutes without incident. She was provided with Vaccine Information Sheet and instruction to access the V-Safe system.   Kristen Becker was instructed to call 911 with any severe reactions post vaccine: Marland Kitchen Difficulty breathing  . Swelling of face and throat  . A fast heartbeat  . A bad rash all over body  . Dizziness and weakness

## 2020-03-05 NOTE — Progress Notes (Signed)
BP 129/65   Pulse 60   Temp 98.1 F (36.7 C) (Temporal)   Ht '5\' 5"'  (1.651 m)   Wt 207 lb 6.4 oz (94.1 kg)   BMI 34.51 kg/m    Subjective:   Patient ID: Kristen Becker, female    DOB: 11/16/1950, 70 y.o.   MRN: [id number]  HPI: Kristen Becker is a 70 y.o. female presenting on 03/05/2020 for Medical Management of Chronic Issues   HPI Recheck of chronic medical issues Patient is coming in for recheck of chronic medical issues.  She is currently on a CPAP machine at 12 m of water mercury of pressure and she says she uses it every night and even has to use it when she takes naps because she will stop breathing.  She says she was told she had severe sleep apnea in her sleep study which was last done on December 2010.  She would like to reestablish with somebody for this.  The only other major issue that she complains of is she is having some chronic back pain issues that is bilateral lumbar pain that has not changed or worsened but has not improved.  She was seen Dr. Inda Merlin for this and some knee issues, she says the knee issues are back but would like to go back to him to discuss the back issues.  Patient is due for bone density scan, she also gynecological exams with Dr. Luan Pulling  Relevant past medical, surgical, family and social history reviewed and updated as indicated. Interim medical history since our last visit reviewed. Allergies and medications reviewed and updated.  Review of Systems  Constitutional: Negative for chills and fever.  HENT: Negative for congestion, ear discharge and ear pain.   Eyes: Negative for redness and visual disturbance.  Respiratory: Negative for chest tightness and shortness of breath.   Cardiovascular: Negative for chest pain and leg swelling.  Genitourinary: Negative for difficulty urinating and dysuria.  Musculoskeletal: Positive for arthralgias and back pain. Negative for gait problem.  Skin: Negative for rash.  Neurological: Negative for  light-headedness and headaches.  Psychiatric/Behavioral: Positive for sleep disturbance. Negative for agitation and behavioral problems.  All other systems reviewed and are negative.   Per HPI unless specifically indicated above   Allergies as of 03/05/2020      Reactions   Codeine Rash   Sulfa Antibiotics Rash      Medication List       Accurate as of March 05, 2020 11:44 AM. If you have any questions, ask your nurse or doctor.        Astaxanthin 4 MG Caps Take 1 tablet by mouth.   estradiol 0.1 MG/GM vaginal cream Commonly known as: ESTRACE Place 1 Applicatorful vaginally at bedtime.   Lysine 1000 MG Tabs Take by mouth.   meloxicam 15 MG tablet Commonly known as: MOBIC Take 0.5-1 tablets (7.5-15 mg total) by mouth daily. (if needed for back/ knee pain)   Methylsulfonylmethane 1000 MG Tabs Take 4 tablets by mouth daily.   PROBIOTIC ACIDOPHILUS PO Take by mouth.   psyllium 58.6 % powder Commonly known as: METAMUCIL Take 1 packet by mouth 3 (three) times daily.   trimethoprim 100 MG tablet Commonly known as: TRIMPEX Take 100 mg by mouth daily.   UNABLE TO FIND Med Name: Leptin   VITAMIN D BOOSTER PO Take 7,500 Units by mouth.        Objective:   BP 129/65   Pulse 60   Temp 98.1 F (36.7  C) (Temporal)   Ht '5\' 5"'  (1.651 m)   Wt 207 lb 6.4 oz (94.1 kg)   BMI 34.51 kg/m   Wt Readings from Last 3 Encounters:  03/05/20 207 lb 6.4 oz (94.1 kg)  04/13/18 198 lb (89.8 kg)  04/06/18 198 lb (89.8 kg)    Physical Exam Vitals and nursing note reviewed.  Constitutional:      General: She is not in acute distress.    Appearance: She is well-developed and well-nourished. She is not diaphoretic.  Eyes:     Extraocular Movements: EOM normal.     Conjunctiva/sclera: Conjunctivae normal.  Cardiovascular:     Rate and Rhythm: Normal rate and regular rhythm.     Pulses: Intact distal pulses.     Heart sounds: Normal heart sounds. No murmur  heard.   Pulmonary:     Effort: Pulmonary effort is normal. No respiratory distress.     Breath sounds: Normal breath sounds. No wheezing.  Musculoskeletal:        General: Tenderness (b/l lumbar tenderness) present. No edema. Normal range of motion.  Skin:    General: Skin is warm and dry.     Findings: No rash.  Neurological:     Mental Status: She is alert and oriented to person, place, and time.     Coordination: Coordination normal.  Psychiatric:        Mood and Affect: Mood and affect normal.        Behavior: Behavior normal.       Assessment & Plan:   Problem List Items Addressed This Visit      Respiratory   Sleep apnea   Relevant Orders   Ambulatory referral to Sleep Studies    Other Visit Diagnoses    Well adult exam    -  Primary   Relevant Orders   Ambulatory referral to Dermatology   CBC with Differential/Platelet   CMP14+EGFR   Lipid panel   TSH   Encounter for immunization       Relevant Orders   Moderna SARS-CoV-2 Vaccine (Completed)      Did referral for sleep studies, will give her meloxicam to help with her back.  Referral to dermatology for yearly checkup.  We will check blood work today. Follow up plan: Return in about 1 year (around 03/05/2021), or if symptoms worsen or fail to improve, for Physical.  Counseling provided for all of the vaccine components Orders Placed This Encounter  Procedures  . Moderna SARS-CoV-2 Vaccine  . CBC with Differential/Platelet  . CMP14+EGFR  . Lipid panel  . TSH  . Ambulatory referral to Dermatology  . Ambulatory referral to Sleep Studies    Caryl Pina, MD [facility] 03/05/2020, 11:44 AM

## 2020-03-05 NOTE — Addendum Note (Signed)
Addended by: Lanier Prude D on: 03/05/2020 11:56 AM   Modules accepted: Orders

## 2020-03-06 LAB — CBC WITH DIFFERENTIAL/PLATELET
Basophils Absolute: 0.1 10*3/uL (ref 0.0–0.2)
Basos: 1 %
EOS (ABSOLUTE): 0.1 10*3/uL (ref 0.0–0.4)
Eos: 3 %
Hematocrit: 41.2 % (ref 34.0–46.6)
Hemoglobin: 13.7 g/dL (ref 11.1–15.9)
Immature Grans (Abs): 0 10*3/uL (ref 0.0–0.1)
Immature Granulocytes: 0 %
Lymphocytes Absolute: 1.7 10*3/uL (ref 0.7–3.1)
Lymphs: 34 %
MCH: 30.4 pg (ref 26.6–33.0)
MCHC: 33.3 g/dL (ref 31.5–35.7)
MCV: 91 fL (ref 79–97)
Monocytes Absolute: 0.5 10*3/uL (ref 0.1–0.9)
Monocytes: 9 %
Neutrophils Absolute: 2.7 10*3/uL (ref 1.4–7.0)
Neutrophils: 53 %
RBC: 4.51 x10E6/uL (ref 3.77–5.28)
RDW: 12.3 % (ref 11.7–15.4)
WBC: 5 10*3/uL (ref 3.4–10.8)

## 2020-03-06 LAB — TSH: TSH: 1.46 u[IU]/mL (ref 0.450–4.500)

## 2020-03-06 LAB — CMP14+EGFR
ALT: 20 IU/L (ref 0–32)
AST: 18 IU/L (ref 0–40)
Albumin/Globulin Ratio: 2.1 (ref 1.2–2.2)
Albumin: 4.4 g/dL (ref 3.8–4.8)
Alkaline Phosphatase: 79 IU/L (ref 44–121)
BUN/Creatinine Ratio: 13 (ref 12–28)
BUN: 12 mg/dL (ref 8–27)
Bilirubin Total: 0.3 mg/dL (ref 0.0–1.2)
CO2: 25 mmol/L (ref 20–29)
Calcium: 9.7 mg/dL (ref 8.7–10.3)
Chloride: 104 mmol/L (ref 96–106)
Creatinine, Ser: 0.93 mg/dL (ref 0.57–1.00)
GFR calc Af Amer: 73 mL/min/{1.73_m2} (ref >59)
GFR calc non Af Amer: 63 mL/min/{1.73_m2} (ref >59)
Globulin, Total: 2.1 g/dL (ref 1.5–4.5)
Glucose: 87 mg/dL (ref 65–99)
Potassium: 4.4 mmol/L (ref 3.5–5.2)
Sodium: 141 mmol/L (ref 134–144)
Total Protein: 6.5 g/dL (ref 6.0–8.5)

## 2020-03-06 LAB — LIPID PANEL
Chol/HDL Ratio: 2.6 ratio (ref 0.0–4.4)
Cholesterol, Total: 217 mg/dL — ABNORMAL HIGH (ref 100–199)
HDL: 83 mg/dL (ref >39)
LDL Chol Calc (NIH): 119 mg/dL — ABNORMAL HIGH (ref 0–99)
Triglycerides: 84 mg/dL (ref 0–149)
VLDL Cholesterol Cal: 15 mg/dL (ref 5–40)

## 2020-03-07 DIAGNOSIS — M8589 Other specified disorders of bone density and structure, multiple sites: Secondary | ICD-10-CM | POA: Diagnosis not present

## 2020-03-07 DIAGNOSIS — Z780 Asymptomatic menopausal state: Secondary | ICD-10-CM | POA: Diagnosis not present

## 2020-03-10 DIAGNOSIS — G4733 Obstructive sleep apnea (adult) (pediatric): Secondary | ICD-10-CM | POA: Diagnosis not present

## 2020-03-13 DIAGNOSIS — B356 Tinea cruris: Secondary | ICD-10-CM | POA: Diagnosis not present

## 2020-03-13 DIAGNOSIS — L280 Lichen simplex chronicus: Secondary | ICD-10-CM | POA: Diagnosis not present

## 2020-03-13 DIAGNOSIS — D485 Neoplasm of uncertain behavior of skin: Secondary | ICD-10-CM | POA: Diagnosis not present

## 2020-03-13 DIAGNOSIS — L821 Other seborrheic keratosis: Secondary | ICD-10-CM | POA: Diagnosis not present

## 2020-03-21 ENCOUNTER — Other Ambulatory Visit: Payer: Self-pay

## 2020-03-21 ENCOUNTER — Ambulatory Visit (INDEPENDENT_AMBULATORY_CARE_PROVIDER_SITE_OTHER): Payer: Medicare Other

## 2020-03-21 DIAGNOSIS — Z23 Encounter for immunization: Secondary | ICD-10-CM

## 2020-03-25 ENCOUNTER — Telehealth: Payer: Self-pay

## 2020-03-25 NOTE — Telephone Encounter (Signed)
Pt is coming 03/26/20 for shot. She is aware of upfront cost

## 2020-03-26 ENCOUNTER — Other Ambulatory Visit: Payer: Self-pay

## 2020-03-26 ENCOUNTER — Ambulatory Visit (INDEPENDENT_AMBULATORY_CARE_PROVIDER_SITE_OTHER): Payer: Medicare Other | Admitting: Family Medicine

## 2020-03-26 DIAGNOSIS — Z23 Encounter for immunization: Secondary | ICD-10-CM

## 2020-03-27 DIAGNOSIS — L814 Other melanin hyperpigmentation: Secondary | ICD-10-CM | POA: Diagnosis not present

## 2020-03-27 DIAGNOSIS — L821 Other seborrheic keratosis: Secondary | ICD-10-CM | POA: Diagnosis not present

## 2020-03-27 DIAGNOSIS — L570 Actinic keratosis: Secondary | ICD-10-CM | POA: Diagnosis not present

## 2020-03-27 DIAGNOSIS — L304 Erythema intertrigo: Secondary | ICD-10-CM | POA: Diagnosis not present

## 2020-03-27 DIAGNOSIS — D2261 Melanocytic nevi of right upper limb, including shoulder: Secondary | ICD-10-CM | POA: Diagnosis not present

## 2020-03-27 DIAGNOSIS — D2271 Melanocytic nevi of right lower limb, including hip: Secondary | ICD-10-CM | POA: Diagnosis not present

## 2020-03-27 DIAGNOSIS — D485 Neoplasm of uncertain behavior of skin: Secondary | ICD-10-CM | POA: Diagnosis not present

## 2020-04-08 DIAGNOSIS — H04123 Dry eye syndrome of bilateral lacrimal glands: Secondary | ICD-10-CM | POA: Diagnosis not present

## 2020-04-24 DIAGNOSIS — D485 Neoplasm of uncertain behavior of skin: Secondary | ICD-10-CM | POA: Diagnosis not present

## 2020-04-24 DIAGNOSIS — D2261 Melanocytic nevi of right upper limb, including shoulder: Secondary | ICD-10-CM | POA: Diagnosis not present

## 2020-06-11 DIAGNOSIS — R3989 Other symptoms and signs involving the genitourinary system: Secondary | ICD-10-CM | POA: Diagnosis not present

## 2020-06-11 DIAGNOSIS — N390 Urinary tract infection, site not specified: Secondary | ICD-10-CM | POA: Diagnosis not present

## 2020-08-13 DIAGNOSIS — Z20822 Contact with and (suspected) exposure to covid-19: Secondary | ICD-10-CM | POA: Diagnosis not present

## 2020-09-07 ENCOUNTER — Other Ambulatory Visit: Payer: Self-pay | Admitting: Family Medicine

## 2020-10-23 DIAGNOSIS — N952 Postmenopausal atrophic vaginitis: Secondary | ICD-10-CM | POA: Diagnosis not present

## 2020-10-23 DIAGNOSIS — Z124 Encounter for screening for malignant neoplasm of cervix: Secondary | ICD-10-CM | POA: Diagnosis not present

## 2020-10-23 DIAGNOSIS — Z6833 Body mass index (BMI) 33.0-33.9, adult: Secondary | ICD-10-CM | POA: Diagnosis not present

## 2020-10-23 DIAGNOSIS — Z1231 Encounter for screening mammogram for malignant neoplasm of breast: Secondary | ICD-10-CM | POA: Diagnosis not present

## 2020-10-23 DIAGNOSIS — Z803 Family history of malignant neoplasm of breast: Secondary | ICD-10-CM | POA: Diagnosis not present

## 2020-11-05 ENCOUNTER — Ambulatory Visit (INDEPENDENT_AMBULATORY_CARE_PROVIDER_SITE_OTHER): Payer: Medicare Other

## 2020-11-05 ENCOUNTER — Other Ambulatory Visit: Payer: Self-pay

## 2020-11-05 DIAGNOSIS — Z23 Encounter for immunization: Secondary | ICD-10-CM

## 2020-11-05 NOTE — Progress Notes (Signed)
Shingrix vaccine given to left deltoid.  Patient tolerated well. 

## 2021-01-05 ENCOUNTER — Ambulatory Visit (INDEPENDENT_AMBULATORY_CARE_PROVIDER_SITE_OTHER): Payer: Medicare Other

## 2021-01-05 DIAGNOSIS — Z23 Encounter for immunization: Secondary | ICD-10-CM

## 2021-02-11 ENCOUNTER — Ambulatory Visit (INDEPENDENT_AMBULATORY_CARE_PROVIDER_SITE_OTHER): Payer: Medicare Other

## 2021-02-11 VITALS — Ht 65.0 in | Wt 195.0 lb

## 2021-02-11 DIAGNOSIS — Z0000 Encounter for general adult medical examination without abnormal findings: Secondary | ICD-10-CM | POA: Diagnosis not present

## 2021-02-11 NOTE — Progress Notes (Signed)
Subjective:   Kristen Becker is a 71 y.o. female who presents for Medicare Annual (Subsequent) preventive examination.Home  Virtual Visit via Telephone Note  I connected with  Kristen Becker on 02/11/21 at 11:15 AM EST by telephone and verified that I am speaking with the correct person using two identifiers.  Location: Patient: Home Provider: [facility] Persons participating in the virtual visit: patient/Nurse Health Advisor   I discussed the limitations, risks, security and privacy concerns of performing an evaluation and management service by telephone and the availability of in person appointments. The patient expressed understanding and agreed to proceed.  Interactive audio and video telecommunications were attempted between this nurse and patient, however failed, due to patient having technical difficulties OR patient did not have access to video capability.  We continued and completed visit with audio only.  Some vital signs may be absent or patient reported.   Ranen Doolin E Kobee Medlen, LPN   Review of Systems     Cardiac Risk Factors include: advanced age (>62men, >64 women);obesity (BMI >30kg/m2)     Objective:    Today's Vitals   02/11/21 1050  Weight: 195 lb (88.5 kg)  Height: 5\' 5"  (1.651 m)  PainSc: 5    Body mass index is 32.45 kg/m.  Advanced Directives 02/11/2021 02/11/2020 05/24/2016  Does Patient Have a Medical Advance Directive? No No No  Would patient like information on creating a medical advance directive? No - Patient declined Yes (MAU/Ambulatory/Procedural Areas - Information given) Yes (MAU/Ambulatory/Procedural Areas - Information given)    Current Medications (verified) Outpatient Encounter Medications as of 02/11/2021  Medication Sig   Astaxanthin 4 MG CAPS Take 1 tablet by mouth.   D-Mannose 500 MG CAPS    Lactobacillus (PROBIOTIC ACIDOPHILUS PO) Take by mouth.   meloxicam (MOBIC) 15 MG tablet TAKE 0.5-1 TABLETS (7.5-15 MG TOTAL) BY MOUTH DAILY. (IF NEEDED FOR  BACK/ KNEE PAIN)   Methylsulfonylmethane 1000 MG TABS Take 4 tablets by mouth daily.   Nutritional Supplements (VITAMIN D BOOSTER PO) Take 7,500 Units by mouth.   trimethoprim (TRIMPEX) 100 MG tablet Take 100 mg by mouth daily.    [DISCONTINUED] estradiol (ESTRACE) 0.1 MG/GM vaginal cream Place 1 Applicatorful vaginally at bedtime.   [DISCONTINUED] Lysine 1000 MG TABS Take by mouth.   [DISCONTINUED] psyllium (METAMUCIL) 58.6 % powder Take 1 packet by mouth 3 (three) times daily.   [DISCONTINUED] UNABLE TO FIND Med Name: Leptin   No facility-administered encounter medications on file as of 02/11/2021.    Allergies (verified) Codeine and Sulfa antibiotics   History: Past Medical History:  Diagnosis Date   Allergy    Lumbar vertebral fracture ([place])    L1 from a fall.   Menopause    Neuropathy    seen spine specialist  - no symptoms of neuropathy   Sleep apnea 2010   CPAP    TMJ (temporomandibular joint syndrome)    Past Surgical History:  Procedure Laterality Date   DILATION AND CURETTAGE OF UTERUS  twice  - last was 1994   TONSILLECTOMY     Family History  Problem Relation Age of Onset   Heart disease Mother        CHF   Cancer Father        prostate   Heart disease Father    Hypertension Father    Neuropathy Brother    Irregular heart beat Brother    Arthritis Brother        rhemotoid and seudo gout   Neuropathy  Brother    Arrhythmia Brother    Neuropathy Brother    Neuropathy Brother    Neuropathy Brother    Kidney Stones Son    Diabetes Maternal Grandmother    Stroke Maternal Grandfather    Social History   Socioeconomic History   Marital status: Married    Spouse name: Sonia Side   Number of children: 2   Years of education: Not on file   Highest education level: Not on file  Occupational History   Occupation: retired  Tobacco Use   Smoking status: Never   Smokeless tobacco: Never  Vaping Use   Vaping Use: Never used  Substance and Sexual Activity    Alcohol use: No   Drug use: No   Sexual activity: Not Currently  Other Topics Concern   Not on file  Social History Narrative   Not on file   Social Determinants of Health   Financial Resource Strain: Low Risk    Difficulty of Paying Living Expenses: Not hard at all  Food Insecurity: No Food Insecurity   Worried About Charity fundraiser in the Last Year: Never true   [place] in the Last Year: Never true  Transportation Needs: No Transportation Needs   Lack of Transportation (Medical): No   Lack of Transportation (Non-Medical): No  Physical Activity: Sufficiently Active   Days of Exercise per Week: 6 days   Minutes of Exercise per Session: 50 min  Stress: No Stress Concern Present   Feeling of Stress : Not at all  Social Connections: Socially Integrated   Frequency of Communication with Friends and Family: More than three times a week   Frequency of Social Gatherings with Friends and Family: More than three times a week   Attends Religious Services: More than 4 times per year   Active Member of [employer] or Organizations: Yes   Attends Music therapist: More than 4 times per year   Marital Status: Married    Tobacco Counseling Counseling given: Not Answered   Clinical Intake:  Pre-visit preparation completed: Yes  Pain : 0-10 Pain Score: 5  Pain Type: Chronic pain Pain Location: Back Pain Orientation: Lower Pain Descriptors / Indicators: Aching, Discomfort, Sore Pain Onset: More than a month ago Pain Frequency: Intermittent     BMI - recorded: 32.45 Nutritional Status: BMI > 30  Obese Nutritional Risks: None Diabetes: No  How often do you need to have someone help you when you read instructions, pamphlets, or other written materials from your doctor or pharmacy?: 1 - Never  Diabetic? no  Interpreter Needed?: No  Information entered by :: Harjot Zavadil, LPN   Activities of Daily Living In your present state of health, do you have any  difficulty performing the following activities: 02/11/2021 02/09/2021  Hearing? N N  Vision? N N  Difficulty concentrating or making decisions? N N  Walking or climbing stairs? N N  Dressing or bathing? N N  Doing errands, shopping? N N  Preparing Food and eating ? N N  Using the Toilet? N N  In the past six months, have you accidently leaked urine? N N  Do you have problems with loss of bowel control? N N  Managing your Medications? N N  Managing your Finances? N N  Housekeeping or managing your Housekeeping? N N  Some recent data might be hidden    Patient Care Team: Dettinger, Fransisca Kaufmann, MD as PCP - General (Family Medicine) Leroy Sea, MD  as Referring Physician (Urology) Feliz Beam, MD as Referring Physician (Ophthalmology) Baird Kay, MD as Referring Physician (Specialist) Johnnette Gourd, MD (Physical Medicine and Rehabilitation) Esaw Dace, MD as Referring Physician (Gastroenterology)  Indicate any recent Medical Services you may have received from other than [facility] providers in the past year (date may be approximate).     Assessment:   This is a routine wellness examination for [place].  Hearing/Vision screen Hearing Screening - Comments:: Denies hearing difficulties  Vision Screening - Comments:: Wears rx glasses - up to date with annul eye exams with Dr Manuella Ghazi  Dietary issues and exercise activities discussed: Current Exercise Habits: Home exercise routine, Type of exercise: treadmill;stretching;strength training/weights, Time (Minutes): 60, Frequency (Times/Week): 5, Weekly Exercise (Minutes/Week): 300, Intensity: Moderate, Exercise limited by: orthopedic condition(s)   Goals Addressed             This Visit's Progress    AWV   On track    02/11/2020 AWV Goal: Fall Prevention  Over the next year, patient will decrease their risk for falls by: Using assistive devices, such as a cane or walker, as needed Identifying fall risks within their home  and correcting them by: Removing throw rugs Adding handrails to stairs or ramps Removing clutter and keeping a clear pathway throughout the home Increasing light, especially at night Adding shower handles/bars Raising toilet seat Identifying potential personal risk factors for falls: Medication side effects Incontinence/urgency Vestibular dysfunction Hearing loss Musculoskeletal disorders Neurological disorders Orthostatic hypotension         Depression Screen PHQ 2/9 Scores 02/11/2021 03/05/2020 02/11/2020 09/19/2017 05/09/2017 06/03/2016 05/24/2016  PHQ - 2 Score 0 0 0 0 0 0 0    Fall Risk Fall Risk  02/11/2021 02/09/2021 03/05/2020 02/11/2020 09/19/2017  Falls in the past year? 0 0 1 0 No  Number falls in past yr: 0 0 1 - -  Injury with Fall? 0 0 0 - -  Risk for fall due to : Orthopedic patient - Impaired balance/gait - -  Follow up Falls prevention discussed - Education provided - -    FALL RISK PREVENTION PERTAINING TO THE HOME:  Any stairs in or around the home? Yes  If so, are there any without handrails? No  Home free of loose throw rugs in walkways, pet beds, electrical cords, etc? Yes  Adequate lighting in your home to reduce risk of falls? Yes   ASSISTIVE DEVICES UTILIZED TO PREVENT FALLS:  Life alert? No  Use of a cane, walker or w/c? No  Grab bars in the bathroom? No  Shower chair or bench in shower? No  Elevated toilet seat or a handicapped toilet? No   TIMED UP AND GO:  Was the test performed? No . Telephonic visit  Cognitive Function: Normal cognitive status assessed by direct observation by this Nurse Health Advisor. No abnormalities found.    MMSE - Mini Mental State Exam 05/24/2016  Orientation to time 5  Orientation to Place 5  Registration 3  Attention/ Calculation 5  Recall 3  Language- name 2 objects 2  Language- repeat 1  Language- follow 3 step command 3  Language- read & follow direction 1  Write a sentence 1  Copy design 1  Total score 30      6CIT Screen 02/11/2020  What Year? 0 points  What month? 0 points  What time? 0 points  Count back from 20 0 points  Months in reverse 0 points  Repeat phrase 0 points  Total Score 0    Immunizations Immunization History  Administered Date(s) Administered   Fluad Quad(high Dose 65+) 01/05/2021   Influenza, High Dose Seasonal PF 01/14/2016, 11/22/2016, 01/18/2018, 12/08/2019   Influenza,inj,Quad PF,6+ Mos 12/05/2013   Moderna Sars-Covid-2 Vaccination 03/12/2019, 04/09/2019, 03/05/2020   Pneumococcal Conjugate-13 05/24/2016   Pneumococcal Polysaccharide-23 03/21/2020   Tdap 09/25/2015   Zoster Recombinat (Shingrix) 03/26/2020, 11/05/2020   Zoster, Live 10/25/2013    TDAP status: Up to date  Flu Vaccine status: Up to date  Pneumococcal vaccine status: Up to date  Covid-19 vaccine status: Completed vaccines  Qualifies for Shingles Vaccine? Yes   Zostavax completed Yes   Shingrix Completed?: Yes  Screening Tests Health Maintenance  Topic Date Due   COVID-19 Vaccine (4 - Booster for Moderna series) 04/30/2020   DEXA SCAN  03/07/2022   MAMMOGRAM  10/24/2022   TETANUS/TDAP  09/24/2025   COLONOSCOPY (Pts 45-28yrs Insurance coverage will need to be confirmed)  06/22/2026   Pneumonia Vaccine 21+ Years old  Completed   INFLUENZA VACCINE  Completed   Hepatitis C Screening  Completed   Zoster Vaccines- Shingrix  Completed   HPV VACCINES  Aged Out    Health Maintenance  Health Maintenance Due  Topic Date Due   COVID-19 Vaccine (4 - Booster for Moderna series) 04/30/2020    Colorectal cancer screening: Type of screening: Colonoscopy. Completed 2018. Repeat every 10 years  Mammogram status: Completed 11/10/2020. Repeat every year  Bone Density status: Completed 03/07/2020. Results reflect: Bone density results: OSTEOPENIA. Repeat every 2 years.  Lung Cancer Screening: (Low Dose CT Chest recommended if Age 25-80 years, 30 pack-year currently smoking OR have quit w/in  15years.) does not qualify.  Additional Screening:  Hepatitis C Screening: does qualify; Completed 04/25/2015  Vision Screening: Recommended annual ophthalmology exams for early detection of glaucoma and other disorders of the eye. Is the patient up to date with their annual eye exam?  Yes  Who is the provider or what is the name of the office in which the patient attends annual eye exams? [facility]ella Gh[facility] at [facility] If pt is not established with a provider, would they like to be referred to a provider to establish care? No .   Dental Screening: Recommended annual dental exams for proper oral hygiene  Community Resource Referral / Chronic Care Management: CRR required this visit?  No   CCM required this visit?  No      Plan:     I have personally reviewed and noted the following in the patients chart:   Medical and social history Use of alcohol, tobacco or illicit drugs  Current medications and supplements including opioid prescriptions.  Functional ability and status Nutritional status Physical activity Advanced directives List of other physicians Hospitalizations, surgeries, and ER visits in previous 12 months Vitals Screenings to include cognitive, depression, and falls Referrals and appointments  In addition, I have reviewed and discussed with patient certain preventive protocols, quality metrics, and best practice recommendations. A written personalized care plan for preventive services as well as general preventive health recommendations were provided to patient.     Sandrea Hammond, LPN   1/0/9323   Nurse Notes: None

## 2021-02-11 NOTE — Patient Instructions (Addendum)
Kristen Becker , Thank you for taking time to come for your Medicare Wellness Visit. I appreciate your ongoing commitment to your health goals. Please review the following plan we discussed and let me know if I can assist you in the future.   Screening recommendations/referrals: Colonoscopy: Done 06/21/2016 - Repeat in 10 years Mammogram: Done 11/10/2020 - Repeat annually  Bone Density: Done 03/07/2020 - Repeat every 2 years  Recommended yearly ophthalmology/optometry visit for glaucoma screening and checkup Recommended yearly dental visit for hygiene and checkup  Vaccinations: Influenza vaccine: Done 03/07/2020 - Repeat annually  Pneumococcal vaccine: Done 05/24/2016 & 03/21/2020 Tdap vaccine: Done 09/25/2015 - Repeat in 10 years Shingles vaccine: Done 03/26/2020 & 11/05/2020 Covid-19: Done 03/12/2019, 04/09/2019, & 03/05/2020  Advanced directives: Advance directive discussed with you today. Even though you declined this today, please call our office should you change your mind, and we can give you the proper paperwork for you to fill out.   Conditions/risks identified: Keep up the great work! Aim for 30 minutes of exercise or brisk walking each day, drink 6-8 glasses of water and eat lots of fruits and vegetables.   Next appointment: Follow up in one year for your annual wellness visit    Preventive Care 65 Years and Older, Female Preventive care refers to lifestyle choices and visits with your health care provider that can promote health and wellness. What does preventive care include? A yearly physical exam. This is also called an annual well check. Dental exams once or twice a year. Routine eye exams. Ask your health care provider how often you should have your eyes checked. Personal lifestyle choices, including: Daily care of your teeth and gums. Regular physical activity. Eating a healthy diet. Avoiding tobacco and drug use. Limiting alcohol use. Practicing safe sex. Taking low-dose aspirin  every day. Taking vitamin and mineral supplements as recommended by your health care provider. What happens during an annual well check? The services and screenings done by your health care provider during your annual well check will depend on your age, overall health, lifestyle risk factors, and family history of disease. Counseling  Your health care provider may ask you questions about your: Alcohol use. Tobacco use. Drug use. Emotional well-being. Home and relationship well-being. Sexual activity. Eating habits. History of falls. Memory and ability to understand (cognition). Work and work Statistician. Reproductive health. Screening  You may have the following tests or measurements: Height, weight, and BMI. Blood pressure. Lipid and cholesterol levels. These may be checked every 5 years, or more frequently if you are over 2 years old. Skin check. Lung cancer screening. You may have this screening every year starting at age 34 if you have a 30-pack-year history of smoking and currently smoke or have quit within the past 15 years. Fecal occult blood test (FOBT) of the stool. You may have this test every year starting at age 9. Flexible sigmoidoscopy or colonoscopy. You may have a sigmoidoscopy every 5 years or a colonoscopy every 10 years starting at age 106. Hepatitis C blood test. Hepatitis B blood test. Sexually transmitted disease (STD) testing. Diabetes screening. This is done by checking your blood sugar (glucose) after you have not eaten for a while (fasting). You may have this done every 1-3 years. Bone density scan. This is done to screen for osteoporosis. You may have this done starting at age 69. Mammogram. This may be done every 1-2 years. Talk to your health care provider about how often you should have regular mammograms.  Talk with your health care provider about your test results, treatment options, and if necessary, the need for more tests. Vaccines  Your health  care provider may recommend certain vaccines, such as: Influenza vaccine. This is recommended every year. Tetanus, diphtheria, and acellular pertussis (Tdap, Td) vaccine. You may need a Td booster every 10 years. Zoster vaccine. You may need this after age 73. Pneumococcal 13-valent conjugate (PCV13) vaccine. One dose is recommended after age 85. Pneumococcal polysaccharide (PPSV23) vaccine. One dose is recommended after age 52. Talk to your health care provider about which screenings and vaccines you need and how often you need them. This information is not intended to replace advice given to you by your health care provider. Make sure you discuss any questions you have with your health care provider. Document Released: 02/21/2015 Document Revised: 10/15/2015 Document Reviewed: 11/26/2014 Elsevier Interactive Patient Education  2017 [place] Prevention in the Home Falls can cause injuries. They can happen to people of all ages. There are many things you can do to make your home safe and to help prevent falls. What can I do on the outside of my home? Regularly fix the edges of walkways and driveways and fix any cracks. Remove anything that might make you trip as you walk through a door, such as a raised step or threshold. Trim any bushes or trees on the path to your home. Use bright outdoor lighting. Clear any walking paths of anything that might make someone trip, such as rocks or tools. Regularly check to see if handrails are loose or broken. Make sure that both sides of any steps have handrails. Any raised decks and porches should have guardrails on the edges. Have any leaves, snow, or ice cleared regularly. Use sand or salt on walking paths during winter. Clean up any spills in your garage right away. This includes oil or grease spills. What can I do in the bathroom? Use night lights. Install grab bars by the toilet and in the tub and shower. Do not use towel bars as grab  bars. Use non-skid mats or decals in the tub or shower. If you need to sit down in the shower, use a plastic, non-slip stool. Keep the floor dry. Clean up any water that spills on the floor as soon as it happens. Remove soap buildup in the tub or shower regularly. Attach bath mats securely with double-sided non-slip rug tape. Do not have throw rugs and other things on the floor that can make you trip. What can I do in the bedroom? Use night lights. Make sure that you have a light by your bed that is easy to reach. Do not use any sheets or blankets that are too big for your bed. They should not hang down onto the floor. Have a firm chair that has side arms. You can use this for support while you get dressed. Do not have throw rugs and other things on the floor that can make you trip. What can I do in the kitchen? Clean up any spills right away. Avoid walking on wet floors. Keep items that you use a lot in easy-to-reach places. If you need to reach something above you, use a strong step stool that has a grab bar. Keep electrical cords out of the way. Do not use floor polish or wax that makes floors slippery. If you must use wax, use non-skid floor wax. Do not have throw rugs and other things on the floor that can make  you trip. What can I do with my stairs? Do not leave any items on the stairs. Make sure that there are handrails on both sides of the stairs and use them. Fix handrails that are broken or loose. Make sure that handrails are as long as the stairways. Check any carpeting to make sure that it is firmly attached to the stairs. Fix any carpet that is loose or worn. Avoid having throw rugs at the top or bottom of the stairs. If you do have throw rugs, attach them to the floor with carpet tape. Make sure that you have a light switch at the top of the stairs and the bottom of the stairs. If you do not have them, ask someone to add them for you. What else can I do to help prevent  falls? Wear shoes that: Do not have high heels. Have rubber bottoms. Are comfortable and fit you well. Are closed at the toe. Do not wear sandals. If you use a stepladder: Make sure that it is fully opened. Do not climb a closed stepladder. Make sure that both sides of the stepladder are locked into place. Ask someone to hold it for you, if possible. Clearly mark and make sure that you can see: Any grab bars or handrails. First and last steps. Where the edge of each step is. Use tools that help you move around (mobility aids) if they are needed. These include: Canes. Walkers. Scooters. Crutches. Turn on the lights when you go into a dark area. Replace any light bulbs as soon as they burn out. Set up your furniture so you have a clear path. Avoid moving your furniture around. If any of your floors are uneven, fix them. If there are any pets around you, be aware of where they are. Review your medicines with your doctor. Some medicines can make you feel dizzy. This can increase your chance of falling. Ask your doctor what other things that you can do to help prevent falls. This information is not intended to replace advice given to you by your health care provider. Make sure you discuss any questions you have with your health care provider. Document Released: 11/21/2008 Document Revised: 07/03/2015 Document Reviewed: 03/01/2014 Elsevier Interactive Patient Education  2017 [employer].

## 2021-02-12 ENCOUNTER — Encounter: Payer: Self-pay | Admitting: Family Medicine

## 2021-02-13 ENCOUNTER — Ambulatory Visit (INDEPENDENT_AMBULATORY_CARE_PROVIDER_SITE_OTHER): Payer: Medicare Other | Admitting: Family Medicine

## 2021-02-13 ENCOUNTER — Encounter: Payer: Self-pay | Admitting: Family Medicine

## 2021-02-13 DIAGNOSIS — R6889 Other general symptoms and signs: Secondary | ICD-10-CM | POA: Diagnosis not present

## 2021-02-13 LAB — VERITOR FLU A/B WAIVED
Influenza A: NEGATIVE
Influenza B: NEGATIVE

## 2021-02-13 NOTE — Progress Notes (Signed)
Virtual Visit via telephone Note  I connected with Kristen Becker on 02/13/21 at 1449 by telephone and verified that I am speaking with the correct person using two identifiers. Kristen Becker is currently located at home and patient are currently with her during visit. The provider, Fransisca Kaufmann Emilygrace Grothe, MD is located in their office at time of visit.  Call ended at 1455  I discussed the limitations, risks, security and privacy concerns of performing an evaluation and management service by telephone and the availability of in person appointments. I also discussed with the patient that there may be a patient responsible charge related to this service. The patient expressed understanding and agreed to proceed.   History and Present Illness: Patient is calling in for body aches that started 2 days ago.  She got worse yesterday.  She has been sleeping.  She developed fever and sore throat and sinus drainage. She is having cough and congestion. She took benadryl and it helped her sleep.  She used cpap and is using sudafed and tylenol and lozenges and they help some. She denies known exposure but she did go to a funeral 5 days ago.    1. Flu-like symptoms     Outpatient Encounter Medications as of 02/13/2021  Medication Sig   Astaxanthin 4 MG CAPS Take 1 tablet by mouth.   D-Mannose 500 MG CAPS    Lactobacillus (PROBIOTIC ACIDOPHILUS PO) Take by mouth.   meloxicam (MOBIC) 15 MG tablet TAKE 0.5-1 TABLETS (7.5-15 MG TOTAL) BY MOUTH DAILY. (IF NEEDED FOR BACK/ KNEE PAIN)   Methylsulfonylmethane 1000 MG TABS Take 4 tablets by mouth daily.   Nutritional Supplements (VITAMIN D BOOSTER PO) Take 7,500 Units by mouth.   trimethoprim (TRIMPEX) 100 MG tablet Take 100 mg by mouth daily.    No facility-administered encounter medications on file as of 02/13/2021.    Review of Systems  Constitutional:  Positive for chills and fever.  HENT:  Positive for congestion, postnasal drip, rhinorrhea and sore throat.  Negative for ear discharge, ear pain, sinus pressure and sneezing.   Eyes:  Negative for pain, redness and visual disturbance.  Respiratory:  Positive for cough. Negative for chest tightness and shortness of breath.   Cardiovascular:  Negative for chest pain and leg swelling.  Musculoskeletal:  Positive for myalgias. Negative for back pain and gait problem.  Skin:  Negative for color change and rash.  Neurological:  Negative for light-headedness and headaches.  Psychiatric/Behavioral:  Negative for agitation and behavioral problems.   All other systems reviewed and are negative.  Observations/Objective: Patient sounds hoarse but otherwise sounds comfortable and in no acute distress  Assessment and Plan: Problem List Items Addressed This Visit   None Visit Diagnoses     Flu-like symptoms    -  Primary   Relevant Orders   Veritor Flu A/B Waived   Novel Coronavirus, NAA ([facility])       Will test for flu and COVID, sounds like possible flu, if positive will give Tamiflu, if negative then may consider supportive treatment until we get the COVID testing back.  Can use nasal saline sprays and antihistamines and Mucinex and call or go to the emergency department if worsens over the weekend. Follow up plan: Return if symptoms worsen or fail to improve.     I discussed the assessment and treatment plan with the patient. The patient was provided an opportunity to ask questions and all were answered. The patient agreed with the plan and demonstrated  an understanding of the instructions.   The patient was advised to call back or seek an in-person evaluation if the symptoms worsen or if the condition fails to improve as anticipated.  The above assessment and management plan was discussed with the patient. The patient verbalized understanding of and has agreed to the management plan. Patient is aware to call the clinic if symptoms persist or worsen. Patient is aware when to return to the clinic  for a follow-up visit. Patient educated on when it is appropriate to go to the emergency department.    I provided 7 minutes of non-face-to-face time during this encounter.    Worthy Rancher, MD

## 2021-02-14 LAB — NOVEL CORONAVIRUS, NAA: SARS-CoV-2, NAA: DETECTED — AB

## 2021-02-14 LAB — SARS-COV-2, NAA 2 DAY TAT

## 2021-02-16 MED ORDER — FLUTICASONE PROPIONATE 50 MCG/ACT NA SUSP: 1.0000 | Freq: Two times a day (BID) | NASAL | 6 refills | Status: AC | PRN

## 2021-02-16 MED ORDER — MOLNUPIRAVIR EUA 200MG CAPSULE: 4.0000 | ORAL_CAPSULE | Freq: Two times a day (BID) | ORAL | 0 refills | Status: AC

## 2021-02-16 MED ORDER — BENZONATATE 100 MG PO CAPS: 100.0000 mg | ORAL_CAPSULE | Freq: Three times a day (TID) | ORAL | 0 refills | Status: DC | PRN

## 2021-03-10 DIAGNOSIS — G4733 Obstructive sleep apnea (adult) (pediatric): Secondary | ICD-10-CM | POA: Diagnosis not present

## 2021-03-12 ENCOUNTER — Encounter: Payer: Self-pay | Admitting: Family Medicine

## 2021-03-12 ENCOUNTER — Ambulatory Visit (INDEPENDENT_AMBULATORY_CARE_PROVIDER_SITE_OTHER): Payer: Medicare Other | Admitting: Family Medicine

## 2021-03-12 VITALS — BP 109/72 | HR 70 | Ht 65.0 in | Wt 197.0 lb

## 2021-03-12 DIAGNOSIS — G4733 Obstructive sleep apnea (adult) (pediatric): Secondary | ICD-10-CM | POA: Diagnosis not present

## 2021-03-12 DIAGNOSIS — M7551 Bursitis of right shoulder: Secondary | ICD-10-CM

## 2021-03-12 DIAGNOSIS — Z13220 Encounter for screening for lipoid disorders: Secondary | ICD-10-CM

## 2021-03-12 DIAGNOSIS — Z131 Encounter for screening for diabetes mellitus: Secondary | ICD-10-CM | POA: Diagnosis not present

## 2021-03-12 LAB — BAYER DCA HB A1C WAIVED: HB A1C (BAYER DCA - WAIVED): 5.5 % (ref 4.8–5.6)

## 2021-03-12 MED ORDER — METHYLPREDNISOLONE ACETATE 80 MG/ML IJ SUSP
80.0000 mg | Freq: Once | INTRAMUSCULAR | Status: AC
  Administered 2021-03-12: 80 mg via INTRAMUSCULAR

## 2021-03-12 NOTE — Progress Notes (Signed)
BP 109/72    Pulse 70    Ht '5\' 5"'  (1.651 m)    Wt 197 lb (89.4 kg)    SpO2 97%    BMI 32.78 kg/m    Subjective:   Patient ID: [name], female    DOB: 26-May-1950, 71 y.o.   MRN: [id number]  HPI: Jessamyn Watterson is a 71 y.o. female presenting on 03/12/2021 for Medical Management of Chronic Issues and Back Pain (Shoulder pain/)   HPI Right shoulder pain Patient is coming in today complaint of right shoulder pain that hurts in the center of her right shoulder it hurts with certain movements especially reaching across her body in front of her.  She denies any numbness.  She says the pain does especially keep her up at night.  She has been trying some over-the-counter stuff such as Aleve.  She is feels like the meloxicam did not help at all.  Sleep apnea Patient uses his sleep apnea machine 100 percent of the night Patient uses a sleep apnea machine 30 days of the month Patient feels like his CPAP makes a significant difference in his sleep and energy. Patient is on CPAP settings auto.  She sees a sleep doctor.  Osteoporosis/osteopenia Fractures or history of fracture: None Medication: Vitamin D and calcium over-the-counter Duration of treatment: Couple years Last bone density scan: 1 year ago Last T score: -2.0  Relevant past medical, surgical, family and social history reviewed and updated as indicated. Interim medical history since our last visit reviewed. Allergies and medications reviewed and updated.  Review of Systems  Constitutional:  Negative for chills and fever.  Eyes:  Negative for visual disturbance.  Respiratory:  Negative for chest tightness and shortness of breath.   Cardiovascular:  Negative for chest pain and leg swelling.  Musculoskeletal:  Positive for arthralgias. Negative for back pain, gait problem and joint swelling.  Skin:  Negative for rash.  Neurological:  Negative for light-headedness and headaches.  Psychiatric/Behavioral:  Negative for agitation and  behavioral problems.   All other systems reviewed and are negative.  Per HPI unless specifically indicated above   Allergies as of 03/12/2021       Reactions   Codeine Rash   Sulfa Antibiotics Rash        Medication List        Accurate as of March 12, 2021  2:39 PM. If you have any questions, ask your nurse or doctor.          STOP taking these medications    meloxicam 15 MG tablet Commonly known as: MOBIC Stopped by: Fransisca Kaufmann Aanya Haynes, MD       TAKE these medications    Astaxanthin 4 MG Caps Take 1 tablet by mouth.   benzonatate 100 MG capsule Commonly known as: Tessalon Perles Take 1 capsule (100 mg total) by mouth 3 (three) times daily as needed for cough.   D-Mannose 500 MG Caps   fluticasone 50 MCG/ACT nasal spray Commonly known as: FLONASE Place 1 spray into both nostrils 2 (two) times daily as needed for allergies or rhinitis.   Methylsulfonylmethane 1000 MG Tabs Take 4 tablets by mouth daily.   NON FORMULARY Take by mouth in the morning and at bedtime. Pectosol- Modified Citrus Pectide   PROBIOTIC ACIDOPHILUS PO Take by mouth.   trimethoprim 100 MG tablet Commonly known as: TRIMPEX Take 100 mg by mouth daily.   VITAMIN D BOOSTER PO Take 7,500 Units by mouth.  Objective:   BP 109/72    Pulse 70    Ht '5\' 5"'  (1.651 m)    Wt 197 lb (89.4 kg)    SpO2 97%    BMI 32.78 kg/m   Wt Readings from Last 3 Encounters:  03/12/21 197 lb (89.4 kg)  02/11/21 195 lb (88.5 kg)  03/05/20 207 lb 6.4 oz (94.1 kg)    Physical Exam Vitals and nursing note reviewed.  Constitutional:      General: She is not in acute distress.    Appearance: She is well-developed. She is not diaphoretic.  Eyes:     Conjunctiva/sclera: Conjunctivae normal.  Cardiovascular:     Rate and Rhythm: Normal rate and regular rhythm.     Heart sounds: Normal heart sounds. No murmur heard. Pulmonary:     Effort: Pulmonary effort is normal. No respiratory distress.      Breath sounds: Normal breath sounds. No wheezing.  Musculoskeletal:        General: Normal range of motion.     Right shoulder: Tenderness (Pain with cross body range of motion and overhead.  Positive for Hawkins impingement and empty can testing.) present. No crepitus. Normal range of motion. Normal strength.  Skin:    General: Skin is warm and dry.     Findings: No rash.  Neurological:     Mental Status: She is alert and oriented to person, place, and time.     Coordination: Coordination normal.  Psychiatric:        Behavior: Behavior normal.      Assessment & Plan:   Problem List Items Addressed This Visit       Respiratory   Sleep apnea - Primary   Relevant Orders   CBC with Differential/Platelet   TSH   Other Visit Diagnoses     Diabetes mellitus screening       Relevant Orders   CBC with Differential/Platelet   Bayer DCA Hb A1c Waived   Lipid screening       Relevant Orders   CBC with Differential/Platelet   CMP14+EGFR   Lipid panel   Subacromial bursitis of right shoulder joint       Relevant Medications   methylPREDNISolone acetate (DEPO-MEDROL) injection 80 mg (Start on 03/12/2021  2:45 PM)       Concern for subacromial bursitis versus labral tear.  We will do steroid injection and see if we can calm it down.  Right subacromial injection: Consent form signed. Risk factors of bleeding and infection discussed with patient and patient is agreeable towards injection. Patient prepped with Betadine.  Posterior approach towards injection used. Injected 80 mg of Depo-Medrol and 1 mL of 2% lidocaine. Patient tolerated procedure well and no side effects from noted. Minimal to no bleeding. Simple bandage applied after.  Follow up plan: Return in about 1 year (around 03/12/2022), or if symptoms worsen or fail to improve, for Physical exam.  Counseling provided for all of the vaccine components Orders Placed This Encounter  Procedures   CBC with Differential/Platelet    CMP14+EGFR   Lipid panel   TSH   Bayer DCA Hb A1c Waived    Caryl Pina, MD [facility] 03/12/2021, 2:39 PM

## 2021-03-13 LAB — LIPID PANEL
Chol/HDL Ratio: 2.5 ratio (ref 0.0–4.4)
Cholesterol, Total: 217 mg/dL — ABNORMAL HIGH (ref 100–199)
HDL: 88 mg/dL (ref >39)
LDL Chol Calc (NIH): 118 mg/dL — ABNORMAL HIGH (ref 0–99)
Triglycerides: 65 mg/dL (ref 0–149)
VLDL Cholesterol Cal: 11 mg/dL (ref 5–40)

## 2021-03-13 LAB — CMP14+EGFR
ALT: 19 IU/L (ref 0–32)
AST: 15 IU/L (ref 0–40)
Albumin/Globulin Ratio: 2.3 — ABNORMAL HIGH (ref 1.2–2.2)
Albumin: 4.5 g/dL (ref 3.8–4.8)
Alkaline Phosphatase: 81 IU/L (ref 44–121)
BUN/Creatinine Ratio: 17 (ref 12–28)
BUN: 16 mg/dL (ref 8–27)
Bilirubin Total: 0.5 mg/dL (ref 0.0–1.2)
CO2: 25 mmol/L (ref 20–29)
Calcium: 9.5 mg/dL (ref 8.7–10.3)
Chloride: 106 mmol/L (ref 96–106)
Creatinine, Ser: 0.92 mg/dL (ref 0.57–1.00)
Globulin, Total: 2 g/dL (ref 1.5–4.5)
Glucose: 84 mg/dL (ref 70–99)
Potassium: 4.2 mmol/L (ref 3.5–5.2)
Sodium: 143 mmol/L (ref 134–144)
Total Protein: 6.5 g/dL (ref 6.0–8.5)
eGFR: 67 mL/min/{1.73_m2} (ref >59)

## 2021-03-13 LAB — CBC WITH DIFFERENTIAL/PLATELET
Basophils Absolute: 0 10*3/uL (ref 0.0–0.2)
Basos: 1 %
EOS (ABSOLUTE): 0.1 10*3/uL (ref 0.0–0.4)
Eos: 3 %
Hematocrit: 39 % (ref 34.0–46.6)
Hemoglobin: 13.2 g/dL (ref 11.1–15.9)
Immature Grans (Abs): 0 10*3/uL (ref 0.0–0.1)
Immature Granulocytes: 0 %
Lymphocytes Absolute: 2 10*3/uL (ref 0.7–3.1)
Lymphs: 39 %
MCH: 30.6 pg (ref 26.6–33.0)
MCHC: 33.8 g/dL (ref 31.5–35.7)
MCV: 91 fL (ref 79–97)
Monocytes Absolute: 0.4 10*3/uL (ref 0.1–0.9)
Monocytes: 7 %
Neutrophils Absolute: 2.6 10*3/uL (ref 1.4–7.0)
Neutrophils: 50 %
Platelets: 126 10*3/uL — ABNORMAL LOW (ref 150–450)
RBC: 4.31 x10E6/uL (ref 3.77–5.28)
RDW: 12.6 % (ref 11.7–15.4)
WBC: 5.1 10*3/uL (ref 3.4–10.8)

## 2021-03-23 DIAGNOSIS — D239 Other benign neoplasm of skin, unspecified: Secondary | ICD-10-CM | POA: Diagnosis not present

## 2021-03-23 DIAGNOSIS — Z1283 Encounter for screening for malignant neoplasm of skin: Secondary | ICD-10-CM | POA: Diagnosis not present

## 2021-03-23 DIAGNOSIS — D485 Neoplasm of uncertain behavior of skin: Secondary | ICD-10-CM | POA: Diagnosis not present

## 2021-03-23 DIAGNOSIS — L570 Actinic keratosis: Secondary | ICD-10-CM | POA: Diagnosis not present

## 2021-03-23 DIAGNOSIS — D3617 Benign neoplasm of peripheral nerves and autonomic nervous system of trunk, unspecified: Secondary | ICD-10-CM | POA: Diagnosis not present

## 2021-05-11 DIAGNOSIS — G4733 Obstructive sleep apnea (adult) (pediatric): Secondary | ICD-10-CM | POA: Diagnosis not present

## 2021-06-02 DIAGNOSIS — H04123 Dry eye syndrome of bilateral lacrimal glands: Secondary | ICD-10-CM | POA: Diagnosis not present

## 2021-06-10 DIAGNOSIS — N390 Urinary tract infection, site not specified: Secondary | ICD-10-CM | POA: Diagnosis not present

## 2021-06-10 DIAGNOSIS — R3989 Other symptoms and signs involving the genitourinary system: Secondary | ICD-10-CM | POA: Diagnosis not present

## 2021-06-10 DIAGNOSIS — R3129 Other microscopic hematuria: Secondary | ICD-10-CM | POA: Diagnosis not present

## 2021-06-11 DIAGNOSIS — Z20822 Contact with and (suspected) exposure to covid-19: Secondary | ICD-10-CM | POA: Diagnosis not present

## 2021-07-14 DIAGNOSIS — N289 Disorder of kidney and ureter, unspecified: Secondary | ICD-10-CM | POA: Diagnosis not present

## 2021-07-14 DIAGNOSIS — R3129 Other microscopic hematuria: Secondary | ICD-10-CM | POA: Diagnosis not present

## 2021-07-22 DIAGNOSIS — R3129 Other microscopic hematuria: Secondary | ICD-10-CM | POA: Diagnosis not present

## 2021-11-10 DIAGNOSIS — G4733 Obstructive sleep apnea (adult) (pediatric): Secondary | ICD-10-CM | POA: Diagnosis not present

## 2021-11-17 DIAGNOSIS — M5416 Radiculopathy, lumbar region: Secondary | ICD-10-CM | POA: Diagnosis not present

## 2021-11-17 DIAGNOSIS — M5459 Other low back pain: Secondary | ICD-10-CM | POA: Diagnosis not present

## 2021-11-23 ENCOUNTER — Ambulatory Visit (INDEPENDENT_AMBULATORY_CARE_PROVIDER_SITE_OTHER): Payer: Medicare Other

## 2021-11-23 DIAGNOSIS — Z23 Encounter for immunization: Secondary | ICD-10-CM | POA: Diagnosis not present

## 2021-11-24 DIAGNOSIS — M5416 Radiculopathy, lumbar region: Secondary | ICD-10-CM | POA: Diagnosis not present

## 2021-11-24 DIAGNOSIS — M4316 Spondylolisthesis, lumbar region: Secondary | ICD-10-CM | POA: Diagnosis not present

## 2021-11-24 DIAGNOSIS — M4726 Other spondylosis with radiculopathy, lumbar region: Secondary | ICD-10-CM | POA: Diagnosis not present

## 2021-11-24 DIAGNOSIS — M5117 Intervertebral disc disorders with radiculopathy, lumbosacral region: Secondary | ICD-10-CM | POA: Diagnosis not present

## 2021-12-04 DIAGNOSIS — M25511 Pain in right shoulder: Secondary | ICD-10-CM | POA: Diagnosis not present

## 2021-12-04 DIAGNOSIS — M5416 Radiculopathy, lumbar region: Secondary | ICD-10-CM | POA: Diagnosis not present

## 2021-12-04 DIAGNOSIS — R6889 Other general symptoms and signs: Secondary | ICD-10-CM | POA: Diagnosis not present

## 2021-12-07 DIAGNOSIS — M25511 Pain in right shoulder: Secondary | ICD-10-CM | POA: Diagnosis not present

## 2021-12-07 DIAGNOSIS — M47816 Spondylosis without myelopathy or radiculopathy, lumbar region: Secondary | ICD-10-CM | POA: Diagnosis not present

## 2021-12-10 DIAGNOSIS — Z1231 Encounter for screening mammogram for malignant neoplasm of breast: Secondary | ICD-10-CM | POA: Diagnosis not present

## 2021-12-10 DIAGNOSIS — Z124 Encounter for screening for malignant neoplasm of cervix: Secondary | ICD-10-CM | POA: Diagnosis not present

## 2021-12-10 DIAGNOSIS — N952 Postmenopausal atrophic vaginitis: Secondary | ICD-10-CM | POA: Diagnosis not present

## 2021-12-10 DIAGNOSIS — Z803 Family history of malignant neoplasm of breast: Secondary | ICD-10-CM | POA: Diagnosis not present

## 2021-12-14 DIAGNOSIS — M47816 Spondylosis without myelopathy or radiculopathy, lumbar region: Secondary | ICD-10-CM | POA: Diagnosis not present

## 2022-01-18 DIAGNOSIS — M25511 Pain in right shoulder: Secondary | ICD-10-CM | POA: Diagnosis not present

## 2022-01-18 DIAGNOSIS — M25512 Pain in left shoulder: Secondary | ICD-10-CM | POA: Diagnosis not present

## 2022-01-25 DIAGNOSIS — N390 Urinary tract infection, site not specified: Secondary | ICD-10-CM | POA: Diagnosis not present

## 2022-01-25 DIAGNOSIS — R3129 Other microscopic hematuria: Secondary | ICD-10-CM | POA: Diagnosis not present

## 2022-01-25 DIAGNOSIS — R6889 Other general symptoms and signs: Secondary | ICD-10-CM | POA: Diagnosis not present

## 2022-01-25 DIAGNOSIS — M5416 Radiculopathy, lumbar region: Secondary | ICD-10-CM | POA: Diagnosis not present

## 2022-01-25 DIAGNOSIS — M25511 Pain in right shoulder: Secondary | ICD-10-CM | POA: Diagnosis not present

## 2022-01-26 DIAGNOSIS — M75121 Complete rotator cuff tear or rupture of right shoulder, not specified as traumatic: Secondary | ICD-10-CM | POA: Diagnosis not present

## 2022-01-26 DIAGNOSIS — M25511 Pain in right shoulder: Secondary | ICD-10-CM | POA: Diagnosis not present

## 2022-01-26 DIAGNOSIS — M19011 Primary osteoarthritis, right shoulder: Secondary | ICD-10-CM | POA: Diagnosis not present

## 2022-03-01 ENCOUNTER — Ambulatory Visit (INDEPENDENT_AMBULATORY_CARE_PROVIDER_SITE_OTHER): Payer: Medicare Other

## 2022-03-01 VITALS — Ht 65.0 in | Wt 190.0 lb

## 2022-03-01 DIAGNOSIS — Z0000 Encounter for general adult medical examination without abnormal findings: Secondary | ICD-10-CM | POA: Diagnosis not present

## 2022-03-01 DIAGNOSIS — Z780 Asymptomatic menopausal state: Secondary | ICD-10-CM | POA: Diagnosis not present

## 2022-03-01 NOTE — Patient Instructions (Signed)
Kristen Becker , Thank you for taking time to come for your Medicare Wellness Visit. I appreciate your ongoing commitment to your health goals. Please review the following plan we discussed and let me know if I can assist you in the future.   These are the goals we discussed:  Goals      AWV     02/11/2020 AWV Goal: Fall Prevention  Over the next year, patient will decrease their risk for falls by: Using assistive devices, such as a cane or walker, as needed Identifying fall risks within their home and correcting them by: Removing throw rugs Adding handrails to stairs or ramps Removing clutter and keeping a clear pathway throughout the home Increasing light, especially at night Adding shower handles/bars Raising toilet seat Identifying potential personal risk factors for falls: Medication side effects Incontinence/urgency Vestibular dysfunction Hearing loss Musculoskeletal disorders Neurological disorders Orthostatic hypotension       Weight (lb) < 175 lb (79.4 kg)     Pt's goal weight is 175 lb         This is a list of the screening recommended for you and due dates:  Health Maintenance  Topic Date Due   COVID-19 Vaccine (4 - 2023-24 season) 10/09/2021   DEXA scan (bone density measurement)  03/07/2022   Mammogram  12/11/2022   Medicare Annual Wellness Visit  03/02/2023   DTaP/Tdap/Td vaccine (2 - Td or Tdap) 09/24/2025   Colon Cancer Screening  06/22/2026   Pneumonia Vaccine  Completed   Flu Shot  Completed   Hepatitis C Screening: USPSTF Recommendation to screen - Ages 18-79 yo.  Completed   Zoster (Shingles) Vaccine  Completed   HPV Vaccine  Aged Out    Advanced directives: Advance directive discussed with you today. I have provided a copy for you to complete at home and have notarized. Once this is complete please bring a copy in to our office so we can scan it into your chart.   Conditions/risks identified: Aim for 30 minutes of exercise or brisk walking, 6-8  glasses of water, and 5 servings of fruits and vegetables each day.   Next appointment: Follow up in one year for your annual wellness visit    Preventive Care 65 Years and Older, Female Preventive care refers to lifestyle choices and visits with your health care provider that can promote health and wellness. What does preventive care include? A yearly physical exam. This is also called an annual well check. Dental exams once or twice a year. Routine eye exams. Ask your health care provider how often you should have your eyes checked. Personal lifestyle choices, including: Daily care of your teeth and gums. Regular physical activity. Eating a healthy diet. Avoiding tobacco and drug use. Limiting alcohol use. Practicing safe sex. Taking low-dose aspirin every day. Taking vitamin and mineral supplements as recommended by your health care provider. What happens during an annual well check? The services and screenings done by your health care provider during your annual well check will depend on your age, overall health, lifestyle risk factors, and family history of disease. Counseling  Your health care provider may ask you questions about your: Alcohol use. Tobacco use. Drug use. Emotional well-being. Home and relationship well-being. Sexual activity. Eating habits. History of falls. Memory and ability to understand (cognition). Work and work Statistician. Reproductive health. Screening  You may have the following tests or measurements: Height, weight, and BMI. Blood pressure. Lipid and cholesterol levels. These may be checked every 5  years, or more frequently if you are over 14 years old. Skin check. Lung cancer screening. You may have this screening every year starting at age 73 if you have a 30-pack-year history of smoking and currently smoke or have quit within the past 15 years. Fecal occult blood test (FOBT) of the stool. You may have this test every year starting at age  53. Flexible sigmoidoscopy or colonoscopy. You may have a sigmoidoscopy every 5 years or a colonoscopy every 10 years starting at age 51. Hepatitis C blood test. Hepatitis B blood test. Sexually transmitted disease (STD) testing. Diabetes screening. This is done by checking your blood sugar (glucose) after you have not eaten for a while (fasting). You may have this done every 1-3 years. Bone density scan. This is done to screen for osteoporosis. You may have this done starting at age 28. Mammogram. This may be done every 1-2 years. Talk to your health care provider about how often you should have regular mammograms. Talk with your health care provider about your test results, treatment options, and if necessary, the need for more tests. Vaccines  Your health care provider may recommend certain vaccines, such as: Influenza vaccine. This is recommended every year. Tetanus, diphtheria, and acellular pertussis (Tdap, Td) vaccine. You may need a Td booster every 10 years. Zoster vaccine. You may need this after age 9. Pneumococcal 13-valent conjugate (PCV13) vaccine. One dose is recommended after age 86. Pneumococcal polysaccharide (PPSV23) vaccine. One dose is recommended after age 87. Talk to your health care provider about which screenings and vaccines you need and how often you need them. This information is not intended to replace advice given to you by your health care provider. Make sure you discuss any questions you have with your health care provider. Document Released: 02/21/2015 Document Revised: 10/15/2015 Document Reviewed: 11/26/2014 Elsevier Interactive Patient Education  2017 [place] Prevention in the Home Falls can cause injuries. They can happen to people of all ages. There are many things you can do to make your home safe and to help prevent falls. What can I do on the outside of my home? Regularly fix the edges of walkways and driveways and fix any cracks. Remove  anything that might make you trip as you walk through a door, such as a raised step or threshold. Trim any bushes or trees on the path to your home. Use bright outdoor lighting. Clear any walking paths of anything that might make someone trip, such as rocks or tools. Regularly check to see if handrails are loose or broken. Make sure that both sides of any steps have handrails. Any raised decks and porches should have guardrails on the edges. Have any leaves, snow, or ice cleared regularly. Use sand or salt on walking paths during winter. Clean up any spills in your garage right away. This includes oil or grease spills. What can I do in the bathroom? Use night lights. Install grab bars by the toilet and in the tub and shower. Do not use towel bars as grab bars. Use non-skid mats or decals in the tub or shower. If you need to sit down in the shower, use a plastic, non-slip stool. Keep the floor dry. Clean up any water that spills on the floor as soon as it happens. Remove soap buildup in the tub or shower regularly. Attach bath mats securely with double-sided non-slip rug tape. Do not have throw rugs and other things on the floor that can make you  trip. What can I do in the bedroom? Use night lights. Make sure that you have a light by your bed that is easy to reach. Do not use any sheets or blankets that are too big for your bed. They should not hang down onto the floor. Have a firm chair that has side arms. You can use this for support while you get dressed. Do not have throw rugs and other things on the floor that can make you trip. What can I do in the kitchen? Clean up any spills right away. Avoid walking on wet floors. Keep items that you use a lot in easy-to-reach places. If you need to reach something above you, use a strong step stool that has a grab bar. Keep electrical cords out of the way. Do not use floor polish or wax that makes floors slippery. If you must use wax, use  non-skid floor wax. Do not have throw rugs and other things on the floor that can make you trip. What can I do with my stairs? Do not leave any items on the stairs. Make sure that there are handrails on both sides of the stairs and use them. Fix handrails that are broken or loose. Make sure that handrails are as long as the stairways. Check any carpeting to make sure that it is firmly attached to the stairs. Fix any carpet that is loose or worn. Avoid having throw rugs at the top or bottom of the stairs. If you do have throw rugs, attach them to the floor with carpet tape. Make sure that you have a light switch at the top of the stairs and the bottom of the stairs. If you do not have them, ask someone to add them for you. What else can I do to help prevent falls? Wear shoes that: Do not have high heels. Have rubber bottoms. Are comfortable and fit you well. Are closed at the toe. Do not wear sandals. If you use a stepladder: Make sure that it is fully opened. Do not climb a closed stepladder. Make sure that both sides of the stepladder are locked into place. Ask someone to hold it for you, if possible. Clearly mark and make sure that you can see: Any grab bars or handrails. First and last steps. Where the edge of each step is. Use tools that help you move around (mobility aids) if they are needed. These include: Canes. Walkers. Scooters. Crutches. Turn on the lights when you go into a dark area. Replace any light bulbs as soon as they burn out. Set up your furniture so you have a clear path. Avoid moving your furniture around. If any of your floors are uneven, fix them. If there are any pets around you, be aware of where they are. Review your medicines with your doctor. Some medicines can make you feel dizzy. This can increase your chance of falling. Ask your doctor what other things that you can do to help prevent falls. This information is not intended to replace advice given to  you by your health care provider. Make sure you discuss any questions you have with your health care provider. Document Released: 11/21/2008 Document Revised: 07/03/2015 Document Reviewed: 03/01/2014 Elsevier Interactive Patient Education  2017 [employer].

## 2022-03-01 NOTE — Progress Notes (Signed)
Subjective:   Kristen Becker is a 72 y.o. female who presents for Medicare Annual (Subsequent) preventive examination. I connected with  [name] on 03/01/22 by a audio enabled telemedicine application and verified that I am speaking with the correct person using two identifiers.  Patient Location: Home  Provider Location: Home Office  I discussed the limitations of evaluation and management by telemedicine. The patient expressed understanding and agreed to proceed.  Review of Systems     Cardiac Risk Factors include: advanced age (>95mn, >>71women)     Objective:    Today's Vitals   03/01/22 1117  Weight: 190 lb (86.2 kg)  Height: '5\' 5"'$  (1.651 m)   Body mass index is 31.62 kg/m.     03/01/2022   11:24 AM 02/11/2021   10:55 AM 02/11/2020   12:26 PM 05/24/2016    3:51 PM  Advanced Directives  Does Patient Have a Medical Advance Directive? No No No No  Would patient like information on creating a medical advance directive? No - Patient declined No - Patient declined Yes (MAU/Ambulatory/Procedural Areas - Information given) Yes (MAU/Ambulatory/Procedural Areas - Information given)    Current Medications (verified) Outpatient Encounter Medications as of 03/01/2022  Medication Sig   Astaxanthin 4 MG CAPS Take 1 tablet by mouth.   D-Mannose 500 MG CAPS    diclofenac (VOLTAREN) 75 MG EC tablet TAKE 1 TABLET BY MOUTH WITH BREAKFAST AND WITH EVENING MEAL   fluticasone (FLONASE) 50 MCG/ACT nasal spray Place 1 spray into both nostrils 2 (two) times daily as needed for allergies or rhinitis.   Lactobacillus (PROBIOTIC ACIDOPHILUS PO) Take by mouth.   Methylsulfonylmethane 1000 MG TABS Take 4 tablets by mouth daily.   Nutritional Supplements (VITAMIN D BOOSTER PO) Take 7,500 Units by mouth.   tiZANidine (ZANAFLEX) 4 MG tablet TAKE 1 TABLET (4 MG) BY MOUTH EVERY 8 (EIGHT) HOURS IF NEEDED FOR MUSCLE SPASMS FOR UP TO 14 DAYS.   trimethoprim (TRIMPEX) 100 MG tablet Take 100 mg by  mouth daily.    benzonatate (TESSALON PERLES) 100 MG capsule Take 1 capsule (100 mg total) by mouth 3 (three) times daily as needed for cough. (Patient not taking: Reported on 03/01/2022)   Biotin 5000 MCG CAPS    NON FORMULARY Take by mouth in the morning and at bedtime. Pectosol- Modified Citrus Pectide (Patient not taking: Reported on 03/01/2022)   No facility-administered encounter medications on file as of 03/01/2022.    Allergies (verified) Codeine and Sulfa antibiotics   History: Past Medical History:  Diagnosis Date   Allergy    [place]    L1 from a fall.   Menopause    Neuropathy    seen spine specialist  - no symptoms of neuropathy   Sleep apnea 2010   CPAP    TMJ (temporomandibular joint syndrome)    Past Surgical History:  Procedure Laterality Date   DILATION AND CURETTAGE OF UTERUS  twice  - last was 1994   TONSILLECTOMY     Family History  Problem Relation Age of Onset   Heart disease Mother        CHF   Cancer Father        prostate   Heart disease Father    Hypertension Father    Neuropathy Brother    Irregular heart beat Brother    Arthritis Brother        rhemotoid and seudo gout   Neuropathy Brother    Arrhythmia Brother  Neuropathy Brother    Neuropathy Brother    Neuropathy Brother    Kidney Stones Son    Diabetes Maternal Grandmother    Stroke Maternal Grandfather    Social History   Socioeconomic History   Marital status: Married    Spouse name: Sonia Side   Number of children: 2   Years of education: Not on file   Highest education level: Not on file  Occupational History   Occupation: retired  Tobacco Use   Smoking status: Never   Smokeless tobacco: Never  Vaping Use   Vaping Use: Never used  Substance and Sexual Activity   Alcohol use: No   Drug use: No   Sexual activity: Not Currently  Other Topics Concern   Not on file  Social History Narrative   Not on file   Social Determinants of Health    Financial Resource Strain: Low Risk  (03/01/2022)   Overall Financial Resource Strain (CARDIA)    Difficulty of Paying Living Expenses: Not hard at all  Food Insecurity: No Food Insecurity (03/01/2022)   Hunger Vital Sign    Worried About Running Out of Food in the Last Year: Never true    [place] in the Last Year: Never true  Transportation Needs: No Transportation Needs (03/01/2022)   PRAPARE - Hydrologist (Medical): No    Lack of Transportation (Non-Medical): No  Physical Activity: Sufficiently Active (03/01/2022)   Exercise Vital Sign    Days of Exercise per Week: 5 days    Minutes of Exercise per Session: 30 min  Stress: No Stress Concern Present (03/01/2022)   [place]    Feeling of Stress : Not at all  Social Connections: Moderately Integrated (03/01/2022)   Social Connection and Isolation Panel [NHANES]    Frequency of Communication with Friends and Family: More than three times a week    Frequency of Social Gatherings with Friends and Family: More than three times a week    Attends Religious Services: More than 4 times per year    Active Member of [employer] or Organizations: No    Attends Music therapist: Never    Marital Status: Married    Tobacco Counseling Counseling given: Not Answered   Clinical Intake:  Pre-visit preparation completed: Yes  Pain : No/denies pain     Nutritional Risks: None Diabetes: No  How often do you need to have someone help you when you read instructions, pamphlets, or other written materials from your doctor or pharmacy?: 1 - Never  Diabetic?no   Interpreter Needed?: No  Information entered by :: Jadene Pierini, LPN   Activities of Daily Living    03/01/2022   11:24 AM  In your present state of health, do you have any difficulty performing the following activities:  Hearing? 0  Vision? 0  Difficulty concentrating  or making decisions? 0  Walking or climbing stairs? 0  Dressing or bathing? 0  Doing errands, shopping? 0  Preparing Food and eating ? N  Using the Toilet? N  In the past six months, have you accidently leaked urine? N  Do you have problems with loss of bowel control? N  Managing your Medications? N  Managing your Finances? N  Housekeeping or managing your Housekeeping? N    Patient Care Team: Dettinger, Fransisca Kaufmann, MD as PCP - General (Family Medicine) Leroy Sea, MD as Referring Physician (Urology) Feliz Beam,  MD as Referring Physician (Ophthalmology) Baird Kay, MD as Referring Physician (Specialist) Johnnette Gourd, MD (Physical Medicine and Rehabilitation) Esaw Dace, MD as Referring Physician (Gastroenterology)  Indicate any recent Medical Services you may have received from other than [facility] providers in the past year (date may be approximate).     Assessment:   This is a routine wellness examination for [place].  Hearing/Vision screen Vision Screening - Comments:: Wears rx glasses - up to date with routine eye exams with  Dr.Shaw  Dietary issues and exercise activities discussed: Current Exercise Habits: Home exercise routine, Type of exercise: walking, Time (Minutes): 30, Frequency (Times/Week): 5, Weekly Exercise (Minutes/Week): 150, Intensity: Mild, Exercise limited by: None identified   Goals Addressed             This Visit's Progress    AWV   On track    02/11/2020 AWV Goal: Fall Prevention  Over the next year, patient will decrease their risk for falls by: Using assistive devices, such as a cane or walker, as needed Identifying fall risks within their home and correcting them by: Removing throw rugs Adding handrails to stairs or ramps Removing clutter and keeping a clear pathway throughout the home Increasing light, especially at night Adding shower handles/bars Raising toilet seat Identifying potential personal risk factors for  falls: Medication side effects Incontinence/urgency Vestibular dysfunction Hearing loss Musculoskeletal disorders Neurological disorders Orthostatic hypotension         Depression Screen    03/01/2022   11:23 AM 03/12/2021    2:03 PM 02/11/2021    2:54 PM 03/05/2020   11:12 AM 02/11/2020   12:26 PM 09/19/2017    1:15 PM 05/09/2017   11:35 AM  PHQ 2/9 Scores  PHQ - 2 Score 0 0 0 0 0 0 0  PHQ- 9 Score  0         Fall Risk    03/01/2022   11:17 AM 03/12/2021    2:03 PM 02/11/2021    1:15 PM 02/09/2021   11:00 AM 03/05/2020   11:11 AM  [place] in the past year? 0 0 0 0 1  Number falls in past yr: 0  0 0 1  Injury with Fall? 0  0 0 0  Risk for fall due to : No Fall Risks  Orthopedic patient  Impaired balance/gait  Follow up Falls prevention discussed  Falls prevention discussed  Education provided    [place]:  Any stairs in or around the home? Yes  If so, are there any without handrails? No  Home free of loose throw rugs in walkways, pet beds, electrical cords, etc? Yes  Adequate lighting in your home to reduce risk of falls? Yes   ASSISTIVE DEVICES UTILIZED TO PREVENT FALLS:  Life alert? No  Use of a cane, walker or w/c? No  Grab bars in the bathroom? No  Shower chair or bench in shower? Yes  Elevated toilet seat or a handicapped toilet? No       05/24/2016    3:53 PM  MMSE - Mini Mental State Exam  Orientation to time 5  Orientation to Place 5  Registration 3  Attention/ Calculation 5  Recall 3  Language- name 2 objects 2  Language- repeat 1  Language- follow 3 step command 3  Language- read & follow direction 1  Write a sentence 1  Copy design 1  Total score 30  03/01/2022   11:25 AM 02/11/2020   12:00 PM  6CIT Screen  What Year? 0 points 0 points  What month? 0 points 0 points  What time? 0 points 0 points  Count back from 20 0 points 0 points  Months in reverse 0 points 0 points  Repeat phrase 0 points  0 points  Total Score 0 points 0 points    Immunizations Immunization History  Administered Date(s) Administered   Fluad Quad(high Dose 65+) 01/05/2021, 11/23/2021   Influenza, High Dose Seasonal PF 01/14/2016, 11/22/2016, 01/18/2018, 12/08/2019   Influenza,inj,Quad PF,6+ Mos 12/05/2013   Moderna Sars-Covid-2 Vaccination 03/12/2019, 04/09/2019, 03/05/2020   Pneumococcal Conjugate-13 05/24/2016   Pneumococcal Polysaccharide-23 03/21/2020   Tdap 09/25/2015   Zoster Recombinat (Shingrix) 03/26/2020, 11/05/2020   Zoster, Live 10/25/2013    TDAP status: Up to date  Flu Vaccine status: Up to date  Pneumococcal vaccine status: Up to date  Covid-19 vaccine status: Completed vaccines  Qualifies for Shingles Vaccine? Yes   Zostavax completed Yes   Shingrix Completed?: Yes  Screening Tests Health Maintenance  Topic Date Due   COVID-19 Vaccine (4 - 2023-24 season) 10/09/2021   DEXA SCAN  03/07/2022   MAMMOGRAM  12/11/2022   Medicare Annual Wellness (AWV)  03/02/2023   DTaP/Tdap/Td (2 - Td or Tdap) 09/24/2025   COLONOSCOPY (Pts 45-57yr Insurance coverage will need to be confirmed)  06/22/2026   Pneumonia Vaccine 72 Years old  Completed   INFLUENZA VACCINE  Completed   Hepatitis C Screening  Completed   Zoster Vaccines- Shingrix  Completed   HPV VACCINES  Aged Out    Health Maintenance  Health Maintenance Due  Topic Date Due   COVID-19 Vaccine (4 - 2023-24 season) 10/09/2021    Colorectal cancer screening: Type of screening: Colonoscopy. Completed 06/21/2016. Repeat every 10 years  Mammogram status: Completed 12/10/2021. Repeat every year  Bone Density status: Ordered 03/01/2022. Pt provided with contact info and advised to call to schedule appt.  Lung Cancer Screening: (Low Dose CT Chest recommended if Age 72-80years, 30 pack-year currently smoking OR have quit w/in 15years.) does not qualify.   Lung Cancer Screening Referral: n/a  Additional  Screening:  Hepatitis C Screening: does not qualify; Completed 04/25/2015  Vision Screening: Recommended annual ophthalmology exams for early detection of glaucoma and other disorders of the eye. Is the patient up to date with their annual eye exam?  Yes  Who is the provider or what is the name of the office in which the patient attends annual eye exams? Dr.Shaw  If pt is not established with a provider, would they like to be referred to a provider to establish care? No .   Dental Screening: Recommended annual dental exams for proper oral hygiene  Community Resource Referral / Chronic Care Management: CRR required this visit?  No   CCM required this visit?  No      Plan:     I have personally reviewed and noted the following in the patient's chart:   Medical and social history Use of alcohol, tobacco or illicit drugs  Current medications and supplements including opioid prescriptions. Patient is not currently taking opioid prescriptions. Functional ability and status Nutritional status Physical activity Advanced directives List of other physicians Hospitalizations, surgeries, and ER visits in previous 12 months Vitals Screenings to include cognitive, depression, and falls Referrals and appointments  In addition, I have reviewed and discussed with patient certain preventive protocols, quality metrics, and best practice recommendations. A written personalized  care plan for preventive services as well as general preventive health recommendations were provided to patient.     Daphane Shepherd, LPN   [id number]   Nurse Notes: Due DEXA referral submitted

## 2022-03-15 ENCOUNTER — Encounter: Payer: Self-pay | Admitting: Family Medicine

## 2022-03-15 ENCOUNTER — Ambulatory Visit (INDEPENDENT_AMBULATORY_CARE_PROVIDER_SITE_OTHER): Payer: Medicare Other | Admitting: Family Medicine

## 2022-03-15 ENCOUNTER — Ambulatory Visit (INDEPENDENT_AMBULATORY_CARE_PROVIDER_SITE_OTHER): Payer: Medicare Other

## 2022-03-15 VITALS — BP 124/67 | HR 71 | Ht 65.0 in | Wt 192.0 lb

## 2022-03-15 DIAGNOSIS — Z780 Asymptomatic menopausal state: Secondary | ICD-10-CM | POA: Diagnosis not present

## 2022-03-15 DIAGNOSIS — M8588 Other specified disorders of bone density and structure, other site: Secondary | ICD-10-CM | POA: Diagnosis not present

## 2022-03-15 DIAGNOSIS — E669 Obesity, unspecified: Secondary | ICD-10-CM | POA: Diagnosis not present

## 2022-03-15 DIAGNOSIS — E7800 Pure hypercholesterolemia, unspecified: Secondary | ICD-10-CM | POA: Diagnosis not present

## 2022-03-15 DIAGNOSIS — M85851 Other specified disorders of bone density and structure, right thigh: Secondary | ICD-10-CM

## 2022-03-15 DIAGNOSIS — Z6833 Body mass index (BMI) 33.0-33.9, adult: Secondary | ICD-10-CM

## 2022-03-15 DIAGNOSIS — M8589 Other specified disorders of bone density and structure, multiple sites: Secondary | ICD-10-CM | POA: Diagnosis not present

## 2022-03-15 DIAGNOSIS — Z0000 Encounter for general adult medical examination without abnormal findings: Secondary | ICD-10-CM

## 2022-03-15 DIAGNOSIS — Z131 Encounter for screening for diabetes mellitus: Secondary | ICD-10-CM

## 2022-03-15 DIAGNOSIS — E785 Hyperlipidemia, unspecified: Secondary | ICD-10-CM | POA: Insufficient documentation

## 2022-03-15 HISTORY — DX: Hyperlipidemia, unspecified: E78.5

## 2022-03-15 LAB — LIPID PANEL

## 2022-03-15 LAB — BAYER DCA HB A1C WAIVED: HB A1C (BAYER DCA - WAIVED): 5.5 % (ref 4.8–5.6)

## 2022-03-15 NOTE — Progress Notes (Signed)
BP 124/67   Pulse 71   Ht '5\' 5"'$  (1.651 m)   Wt 192 lb (87.1 kg)   SpO2 98%   BMI 31.95 kg/m    Subjective:   Patient ID: Kristen Becker, female    DOB: 1950/10/03, 72 y.o.   MRN: [id number]  HPI: Kristen Becker is a 72 y.o. female presenting on 03/15/2022 for Medical Management of Chronic Issues (CPE)   HPI Physical exam Patient is coming in today for physical exam recheck chronic medical issues.  Patient denies any chest pain, shortness of breath, headaches or vision issues, abdominal complaints, diarrhea, nausea, vomiting, or joint issues.  Patient is also coming in today for weight and dietary recheck.  She is down 7 pounds from last year.  Osteoporosis/osteopenia Fractures or history of fracture: None Medication: Calcium and vitamin D  Duration of treatment: 5 years Last bone density scan: 03/07/2020 Last T score: -2.0    Relevant past medical, surgical, family and social history reviewed and updated as indicated. Interim medical history since our last visit reviewed. Allergies and medications reviewed and updated.  Review of Systems  Constitutional:  Negative for chills and fever.  HENT:  Negative for congestion, ear discharge and ear pain.   Eyes:  Negative for redness and visual disturbance.  Respiratory:  Negative for chest tightness and shortness of breath.   Cardiovascular:  Negative for chest pain and leg swelling.  Genitourinary:  Negative for difficulty urinating and dysuria.  Musculoskeletal:  Negative for back pain and gait problem.  Skin:  Negative for rash.  Neurological:  Negative for dizziness, light-headedness and headaches.  Psychiatric/Behavioral:  Negative for agitation and behavioral problems.   All other systems reviewed and are negative.   Per HPI unless specifically indicated above   Allergies as of 03/15/2022       Reactions   Codeine Rash   Sulfa Antibiotics Rash        Medication List        Accurate as of March 15, 2022 10:33  AM. If you have any questions, ask your nurse or doctor.          STOP taking these medications    benzonatate 100 MG capsule Commonly known as: Best boy Stopped by: Worthy Rancher, MD   NON FORMULARY Stopped by: Fransisca Kaufmann Sania Noy, MD       TAKE these medications    Astaxanthin 4 MG Caps Take 1 tablet by mouth.   Biotin 5000 MCG Caps   COD LIVER OIL PO Take 460 mg by mouth daily. With omega 3 and vit A&D   D-Mannose 500 MG Caps   diclofenac 75 MG EC tablet Commonly known as: VOLTAREN TAKE 1 TABLET BY MOUTH WITH BREAKFAST AND WITH EVENING MEAL   fluticasone 50 MCG/ACT nasal spray Commonly known as: FLONASE Place 1 spray into both nostrils 2 (two) times daily as needed for allergies or rhinitis.   Methylsulfonylmethane 1000 MG Tabs Take 4 tablets by mouth daily.   PROBIOTIC ACIDOPHILUS PO Take by mouth.   tiZANidine 4 MG tablet Commonly known as: ZANAFLEX TAKE 1 TABLET (4 MG) BY MOUTH EVERY 8 (EIGHT) HOURS IF NEEDED FOR MUSCLE SPASMS FOR UP TO 14 DAYS.   trimethoprim 100 MG tablet Commonly known as: TRIMPEX Take 100 mg by mouth daily.   vitamin B-12 250 MCG tablet Commonly known as: CYANOCOBALAMIN Take 250 mcg by mouth daily. MITO, B Complex   vitamin C 250 MG tablet Commonly known as: ASCORBIC ACID  Take 250 mg by mouth daily. With Lavitol DHQ   VITAMIN D BOOSTER PO Take 7,500 Units by mouth.         Objective:   BP 124/67   Pulse 71   Ht '5\' 5"'$  (1.651 m)   Wt 192 lb (87.1 kg)   SpO2 98%   BMI 31.95 kg/m   Wt Readings from Last 3 Encounters:  03/15/22 192 lb (87.1 kg)  03/01/22 190 lb (86.2 kg)  03/12/21 197 lb (89.4 kg)    Physical Exam Vitals and nursing note reviewed.  Constitutional:      General: She is not in acute distress.    Appearance: Normal appearance. She is well-developed. She is not diaphoretic.  HENT:     Right Ear: Tympanic membrane normal.     Left Ear: Tympanic membrane normal.     Mouth/Throat:      Mouth: Mucous membranes are moist.     Pharynx: Oropharynx is clear. No oropharyngeal exudate.  Eyes:     Conjunctiva/sclera: Conjunctivae normal.  Cardiovascular:     Rate and Rhythm: Normal rate and regular rhythm.     Heart sounds: Normal heart sounds. No murmur heard. Pulmonary:     Effort: Pulmonary effort is normal. No respiratory distress.     Breath sounds: Normal breath sounds. No wheezing.  Abdominal:     General: Abdomen is flat. Bowel sounds are normal. There is no distension.     Tenderness: There is no abdominal tenderness. There is no right CVA tenderness, left CVA tenderness, guarding or rebound.  Musculoskeletal:        General: No tenderness. Normal range of motion.  Skin:    General: Skin is warm and dry.     Findings: No rash.  Neurological:     Mental Status: She is alert and oriented to person, place, and time.     Coordination: Coordination normal.  Psychiatric:        Behavior: Behavior normal.       Assessment & Plan:   Problem List Items Addressed This Visit       Musculoskeletal and Integument   Osteopenia   Relevant Orders   CMP14+EGFR   VITAMIN D 25 Hydroxy (Vit-D Deficiency, Fractures)   DG [facility] DEXA     Other   Hyperlipidemia   Relevant Orders   CBC with Differential/Platelet   CMP14+EGFR   Lipid panel   Bayer DCA Hb A1c Waived   VITAMIN D 25 Hydroxy (Vit-D Deficiency, Fractures)   Obesity   Other Visit Diagnoses     Well adult exam    -  Primary   Relevant Orders   CBC with Differential/Platelet   CMP14+EGFR   Lipid panel   Bayer DCA Hb A1c Waived   Diabetes mellitus screening       Relevant Orders   CBC with Differential/Platelet   CMP14+EGFR   Lipid panel   Bayer DCA Hb A1c Waived   VITAMIN D 25 Hydroxy (Vit-D Deficiency, Fractures)       Continue current medicine, will do bone density scan as well today. Follow up plan: Return in about 1 year (around 03/16/2023), or if symptoms worsen or fail to improve, for  Physical exam and recheck cholesterol and osteopenia.  Counseling provided for all of the vaccine components Orders Placed This Encounter  Procedures   DG [facility] DEXA   CBC with Differential/Platelet   CMP14+EGFR   Lipid panel   Bayer DCA Hb A1c Waived   VITAMIN  D 25 Hydroxy (Vit-D Deficiency, Fractures)    Caryl Pina, MD [facility] 03/15/2022, 10:33 AM

## 2022-03-16 LAB — CMP14+EGFR
ALT: 20 IU/L (ref 0–32)
AST: 21 IU/L (ref 0–40)
Albumin/Globulin Ratio: 2.7 — ABNORMAL HIGH (ref 1.2–2.2)
Albumin: 4.6 g/dL (ref 3.8–4.8)
Alkaline Phosphatase: 74 IU/L (ref 44–121)
BUN/Creatinine Ratio: 14 (ref 12–28)
BUN: 11 mg/dL (ref 8–27)
Bilirubin Total: 0.4 mg/dL (ref 0.0–1.2)
CO2: 25 mmol/L (ref 20–29)
Calcium: 9.3 mg/dL (ref 8.7–10.3)
Chloride: 107 mmol/L — ABNORMAL HIGH (ref 96–106)
Creatinine, Ser: 0.79 mg/dL (ref 0.57–1.00)
Globulin, Total: 1.7 g/dL (ref 1.5–4.5)
Glucose: 93 mg/dL (ref 70–99)
Potassium: 4.4 mmol/L (ref 3.5–5.2)
Sodium: 146 mmol/L — ABNORMAL HIGH (ref 134–144)
Total Protein: 6.3 g/dL (ref 6.0–8.5)
eGFR: 80 mL/min/{1.73_m2} (ref >59)

## 2022-03-16 LAB — CBC WITH DIFFERENTIAL/PLATELET
Basophils Absolute: 0 10*3/uL (ref 0.0–0.2)
Basos: 1 %
EOS (ABSOLUTE): 0.1 10*3/uL (ref 0.0–0.4)
Eos: 1 %
Hematocrit: 41.1 % (ref 34.0–46.6)
Hemoglobin: 13.9 g/dL (ref 11.1–15.9)
Immature Grans (Abs): 0 10*3/uL (ref 0.0–0.1)
Immature Granulocytes: 0 %
Lymphocytes Absolute: 1.7 10*3/uL (ref 0.7–3.1)
Lymphs: 30 %
MCH: 30.5 pg (ref 26.6–33.0)
MCHC: 33.8 g/dL (ref 31.5–35.7)
MCV: 90 fL (ref 79–97)
Monocytes Absolute: 0.4 10*3/uL (ref 0.1–0.9)
Monocytes: 7 %
Neutrophils Absolute: 3.5 10*3/uL (ref 1.4–7.0)
Neutrophils: 61 %
Platelets: 143 10*3/uL — ABNORMAL LOW (ref 150–450)
RBC: 4.55 x10E6/uL (ref 3.77–5.28)
RDW: 12.3 % (ref 11.7–15.4)
WBC: 5.7 10*3/uL (ref 3.4–10.8)

## 2022-03-16 LAB — LIPID PANEL
Chol/HDL Ratio: 2.5 ratio (ref 0.0–4.4)
Cholesterol, Total: 228 mg/dL — ABNORMAL HIGH (ref 100–199)
HDL: 91 mg/dL (ref >39)
LDL Chol Calc (NIH): 127 mg/dL — ABNORMAL HIGH (ref 0–99)
Triglycerides: 58 mg/dL (ref 0–149)
VLDL Cholesterol Cal: 10 mg/dL (ref 5–40)

## 2022-03-16 LAB — VITAMIN D 25 HYDROXY (VIT D DEFICIENCY, FRACTURES): Vit D, 25-Hydroxy: 46.9 ng/mL (ref 30.0–100.0)

## 2022-03-17 ENCOUNTER — Other Ambulatory Visit: Payer: Self-pay

## 2022-03-17 DIAGNOSIS — Z780 Asymptomatic menopausal state: Secondary | ICD-10-CM

## 2022-03-23 DIAGNOSIS — D239 Other benign neoplasm of skin, unspecified: Secondary | ICD-10-CM | POA: Diagnosis not present

## 2022-03-23 DIAGNOSIS — Z1283 Encounter for screening for malignant neoplasm of skin: Secondary | ICD-10-CM | POA: Diagnosis not present

## 2022-03-23 DIAGNOSIS — L304 Erythema intertrigo: Secondary | ICD-10-CM | POA: Diagnosis not present

## 2022-03-23 DIAGNOSIS — L570 Actinic keratosis: Secondary | ICD-10-CM | POA: Diagnosis not present

## 2022-04-13 DIAGNOSIS — M75121 Complete rotator cuff tear or rupture of right shoulder, not specified as traumatic: Secondary | ICD-10-CM | POA: Diagnosis not present

## 2022-05-03 ENCOUNTER — Telehealth: Payer: Self-pay | Admitting: Family Medicine

## 2022-05-03 NOTE — Telephone Encounter (Signed)
Contacted Kristen Becker to schedule their annual wellness visit. Appointment made for 03/03/2023.  Thank you,  [name],  [place] [facility] Direct Dial ??[phone]

## 2022-05-07 DIAGNOSIS — M19011 Primary osteoarthritis, right shoulder: Secondary | ICD-10-CM | POA: Diagnosis not present

## 2022-05-07 DIAGNOSIS — M7541 Impingement syndrome of right shoulder: Secondary | ICD-10-CM | POA: Diagnosis not present

## 2022-05-07 DIAGNOSIS — G8918 Other acute postprocedural pain: Secondary | ICD-10-CM | POA: Diagnosis not present

## 2022-05-07 DIAGNOSIS — M65811 Other synovitis and tenosynovitis, right shoulder: Secondary | ICD-10-CM | POA: Diagnosis not present

## 2022-05-07 DIAGNOSIS — M75111 Incomplete rotator cuff tear or rupture of right shoulder, not specified as traumatic: Secondary | ICD-10-CM | POA: Diagnosis not present

## 2022-05-07 DIAGNOSIS — M75121 Complete rotator cuff tear or rupture of right shoulder, not specified as traumatic: Secondary | ICD-10-CM | POA: Diagnosis not present

## 2022-05-07 DIAGNOSIS — M24111 Other articular cartilage disorders, right shoulder: Secondary | ICD-10-CM | POA: Diagnosis not present

## 2022-05-14 DIAGNOSIS — M7989 Other specified soft tissue disorders: Secondary | ICD-10-CM | POA: Diagnosis not present

## 2022-05-16 ENCOUNTER — Encounter: Payer: Self-pay | Admitting: Family Medicine

## 2022-05-17 ENCOUNTER — Ambulatory Visit (INDEPENDENT_AMBULATORY_CARE_PROVIDER_SITE_OTHER): Payer: Medicare Other | Admitting: Family Medicine

## 2022-05-17 ENCOUNTER — Encounter: Payer: Self-pay | Admitting: Family Medicine

## 2022-05-17 VITALS — BP 119/75 | HR 77 | Ht 65.0 in | Wt 201.0 lb

## 2022-05-17 DIAGNOSIS — R600 Localized edema: Secondary | ICD-10-CM | POA: Diagnosis not present

## 2022-05-17 NOTE — Progress Notes (Signed)
BP 119/75   Pulse 77   Ht 5\' 5"  (1.651 m)   Wt 201 lb (91.2 kg)   SpO2 98%   BMI 33.45 kg/m    Subjective:   Patient ID: Kristen Becker, female    DOB: April 30, 1950, 72 y.o.   MRN: [id number]  HPI: Kristen Becker is a 72 y.o. female presenting on 05/17/2022 for Edema (BLE- occurred after right shoulder surgery.Has stinging as well. DVT r/o by doppler with [facility] on 4/5)   HPI Leg swelling Patient had shoulder surgery 05/07/2022.  Since then she has been having bilateral lower extremity swelling.  She did have a DVT ultrasound Doppler on 05/14/2022 that was normal.  She has been sleeping in a recliner and admits to being less active but just was concerned about whether something else could be going on.  She has been taking more Tylenol and ibuprofen and wonders if that could be playing a factor in it.  She says they are very swollen and painful in her legs.  She denies any redness or warmth.  She has been sleeping in a recliner instead of her bed now because that was recommended after the surgery.  Relevant past medical, surgical, family and social history reviewed and updated as indicated. Interim medical history since our last visit reviewed. Allergies and medications reviewed and updated.  Review of Systems  Constitutional:  Negative for chills and fever.  Eyes:  Negative for visual disturbance.  Respiratory:  Negative for chest tightness and shortness of breath.   Cardiovascular:  Positive for leg swelling. Negative for chest pain and palpitations.  Musculoskeletal:  Negative for back pain and gait problem.  Skin:  Negative for rash.  Neurological:  Negative for dizziness, weakness, light-headedness and headaches.  Psychiatric/Behavioral:  Negative for agitation and behavioral problems.   All other systems reviewed and are negative.   Per HPI unless specifically indicated above   Allergies as of 05/17/2022       Reactions   Codeine Rash   Sulfa Antibiotics Rash         Medication List        Accurate as of May 17, 2022 12:08 PM. If you have any questions, ask your nurse or doctor.          Astaxanthin 4 MG Caps Take 1 tablet by mouth.   Biotin 5000 MCG Caps   COD LIVER OIL PO Take 460 mg by mouth daily. With omega 3 and vit A&D   D-Mannose 500 MG Caps   diclofenac 75 MG EC tablet Commonly known as: VOLTAREN TAKE 1 TABLET BY MOUTH WITH BREAKFAST AND WITH EVENING MEAL   fluticasone 50 MCG/ACT nasal spray Commonly known as: FLONASE Place 1 spray into both nostrils 2 (two) times daily as needed for allergies or rhinitis.   Methylsulfonylmethane 1000 MG Tabs Take 4 tablets by mouth daily.   PROBIOTIC ACIDOPHILUS PO Take by mouth.   tiZANidine 4 MG tablet Commonly known as: ZANAFLEX TAKE 1 TABLET (4 MG) BY MOUTH EVERY 8 (EIGHT) HOURS IF NEEDED FOR MUSCLE SPASMS FOR UP TO 14 DAYS.   trimethoprim 100 MG tablet Commonly known as: TRIMPEX Take 100 mg by mouth daily.   vitamin B-12 250 MCG tablet Commonly known as: CYANOCOBALAMIN Take 250 mcg by mouth daily. MITO, B Complex   vitamin C 250 MG tablet Commonly known as: ASCORBIC ACID Take 250 mg by mouth daily. With Lavitol DHQ   VITAMIN D BOOSTER PO Take 7,500 Units by mouth.  Objective:   BP 119/75   Pulse 77   Ht 5\' 5"  (1.651 m)   Wt 201 lb (91.2 kg)   SpO2 98%   BMI 33.45 kg/m   Wt Readings from Last 3 Encounters:  05/17/22 201 lb (91.2 kg)  03/15/22 192 lb (87.1 kg)  03/01/22 190 lb (86.2 kg)    Physical Exam Vitals and nursing note reviewed.  Constitutional:      General: She is not in acute distress.    Appearance: She is well-developed. She is not diaphoretic.  Eyes:     Conjunctiva/sclera: Conjunctivae normal.  Musculoskeletal:        General: Swelling (2+ pitting edema) present. No tenderness. Normal range of motion.  Skin:    General: Skin is warm and dry.     Findings: No erythema or rash.  Neurological:     Mental Status: She is  alert and oriented to person, place, and time.     Coordination: Coordination normal.  Psychiatric:        Behavior: Behavior normal.       Assessment & Plan:   Problem List Items Addressed This Visit   None Visit Diagnoses     Peripheral edema    -  Primary   Relevant Orders   Brain natriuretic peptide   CMP14+EGFR   CBC with Differential/Platelet       Will check her for anemia and check her kidneys and her liver and her BNP to see if there is any issues there.  If all those come back normal, I would suggest that is likely dependent edema and she can use compression stockings during the day and continue to try and walk more and keep her legs elevated. Follow up plan: Return if symptoms worsen or fail to improve.  Counseling provided for all of the vaccine components Orders Placed This Encounter  Procedures   Brain natriuretic peptide   CMP14+EGFR   CBC with Differential/Platelet    Arville Care, MD [facility] 05/17/2022, 12:08 PM

## 2022-05-18 LAB — CBC WITH DIFFERENTIAL/PLATELET
Basophils Absolute: 0 10*3/uL (ref 0.0–0.2)
Basos: 1 %
EOS (ABSOLUTE): 0.1 10*3/uL (ref 0.0–0.4)
Eos: 1 %
Hematocrit: 39.1 % (ref 34.0–46.6)
Hemoglobin: 12.7 g/dL (ref 11.1–15.9)
Immature Grans (Abs): 0 10*3/uL (ref 0.0–0.1)
Immature Granulocytes: 0 %
Lymphocytes Absolute: 1.3 10*3/uL (ref 0.7–3.1)
Lymphs: 19 %
MCH: 29.7 pg (ref 26.6–33.0)
MCHC: 32.5 g/dL (ref 31.5–35.7)
MCV: 92 fL (ref 79–97)
Monocytes Absolute: 0.4 10*3/uL (ref 0.1–0.9)
Monocytes: 6 %
Neutrophils Absolute: 4.8 10*3/uL (ref 1.4–7.0)
Neutrophils: 73 %
Platelets: 167 10*3/uL (ref 150–450)
RBC: 4.27 x10E6/uL (ref 3.77–5.28)
RDW: 12.1 % (ref 11.7–15.4)
WBC: 6.6 10*3/uL (ref 3.4–10.8)

## 2022-05-18 LAB — CMP14+EGFR
ALT: 34 IU/L — ABNORMAL HIGH (ref 0–32)
AST: 17 IU/L (ref 0–40)
Albumin/Globulin Ratio: 1.8 (ref 1.2–2.2)
Albumin: 4.1 g/dL (ref 3.8–4.8)
Alkaline Phosphatase: 112 IU/L (ref 44–121)
BUN/Creatinine Ratio: 19 (ref 12–28)
BUN: 15 mg/dL (ref 8–27)
Bilirubin Total: 0.3 mg/dL (ref 0.0–1.2)
CO2: 22 mmol/L (ref 20–29)
Calcium: 9.4 mg/dL (ref 8.7–10.3)
Chloride: 106 mmol/L (ref 96–106)
Creatinine, Ser: 0.81 mg/dL (ref 0.57–1.00)
Globulin, Total: 2.3 g/dL (ref 1.5–4.5)
Glucose: 91 mg/dL (ref 70–99)
Potassium: 4.4 mmol/L (ref 3.5–5.2)
Sodium: 143 mmol/L (ref 134–144)
Total Protein: 6.4 g/dL (ref 6.0–8.5)
eGFR: 77 mL/min/{1.73_m2} (ref >59)

## 2022-05-18 LAB — BRAIN NATRIURETIC PEPTIDE: BNP: 11.5 pg/mL (ref 0.0–100.0)

## 2022-06-08 DIAGNOSIS — H2513 Age-related nuclear cataract, bilateral: Secondary | ICD-10-CM | POA: Diagnosis not present

## 2022-06-24 DIAGNOSIS — M25511 Pain in right shoulder: Secondary | ICD-10-CM | POA: Diagnosis not present

## 2022-06-24 DIAGNOSIS — M75121 Complete rotator cuff tear or rupture of right shoulder, not specified as traumatic: Secondary | ICD-10-CM | POA: Diagnosis not present

## 2022-07-01 DIAGNOSIS — M25511 Pain in right shoulder: Secondary | ICD-10-CM | POA: Diagnosis not present

## 2022-07-01 DIAGNOSIS — M75121 Complete rotator cuff tear or rupture of right shoulder, not specified as traumatic: Secondary | ICD-10-CM | POA: Diagnosis not present

## 2022-07-13 DIAGNOSIS — M25511 Pain in right shoulder: Secondary | ICD-10-CM | POA: Diagnosis not present

## 2022-07-13 DIAGNOSIS — M75121 Complete rotator cuff tear or rupture of right shoulder, not specified as traumatic: Secondary | ICD-10-CM | POA: Diagnosis not present

## 2022-07-19 DIAGNOSIS — M25511 Pain in right shoulder: Secondary | ICD-10-CM | POA: Diagnosis not present

## 2022-07-19 DIAGNOSIS — M75121 Complete rotator cuff tear or rupture of right shoulder, not specified as traumatic: Secondary | ICD-10-CM | POA: Diagnosis not present

## 2022-08-03 DIAGNOSIS — M25511 Pain in right shoulder: Secondary | ICD-10-CM | POA: Diagnosis not present

## 2022-08-03 DIAGNOSIS — M75121 Complete rotator cuff tear or rupture of right shoulder, not specified as traumatic: Secondary | ICD-10-CM | POA: Diagnosis not present

## 2022-08-10 DIAGNOSIS — M75121 Complete rotator cuff tear or rupture of right shoulder, not specified as traumatic: Secondary | ICD-10-CM | POA: Diagnosis not present

## 2022-08-10 DIAGNOSIS — M25511 Pain in right shoulder: Secondary | ICD-10-CM | POA: Diagnosis not present

## 2022-11-09 DIAGNOSIS — G4733 Obstructive sleep apnea (adult) (pediatric): Secondary | ICD-10-CM | POA: Diagnosis not present

## 2022-12-27 ENCOUNTER — Ambulatory Visit: Payer: Medicare Other

## 2023-01-20 ENCOUNTER — Ambulatory Visit (INDEPENDENT_AMBULATORY_CARE_PROVIDER_SITE_OTHER): Payer: Medicare Other

## 2023-01-20 DIAGNOSIS — Z23 Encounter for immunization: Secondary | ICD-10-CM

## 2023-02-06 ENCOUNTER — Encounter: Payer: Self-pay | Admitting: Family Medicine

## 2023-02-07 DIAGNOSIS — R509 Fever, unspecified: Secondary | ICD-10-CM | POA: Diagnosis not present

## 2023-02-07 DIAGNOSIS — R070 Pain in throat: Secondary | ICD-10-CM | POA: Diagnosis not present

## 2023-02-07 DIAGNOSIS — J0111 Acute recurrent frontal sinusitis: Secondary | ICD-10-CM | POA: Diagnosis not present

## 2023-02-07 DIAGNOSIS — B001 Herpesviral vesicular dermatitis: Secondary | ICD-10-CM | POA: Diagnosis not present

## 2023-02-07 DIAGNOSIS — Z20822 Contact with and (suspected) exposure to covid-19: Secondary | ICD-10-CM | POA: Diagnosis not present

## 2023-02-14 MED ORDER — ONDANSETRON 4 MG PO TBDP: 4.0000 mg | ORAL_TABLET | Freq: Three times a day (TID) | ORAL | 0 refills | Status: AC | PRN

## 2023-02-16 DIAGNOSIS — R3989 Other symptoms and signs involving the genitourinary system: Secondary | ICD-10-CM | POA: Diagnosis not present

## 2023-02-16 DIAGNOSIS — N390 Urinary tract infection, site not specified: Secondary | ICD-10-CM | POA: Diagnosis not present

## 2023-03-03 ENCOUNTER — Other Ambulatory Visit: Payer: Self-pay

## 2023-03-03 ENCOUNTER — Ambulatory Visit: Payer: Medicare Other

## 2023-03-03 VITALS — Ht 65.0 in | Wt 201.0 lb

## 2023-03-03 DIAGNOSIS — Z0000 Encounter for general adult medical examination without abnormal findings: Secondary | ICD-10-CM

## 2023-03-03 NOTE — Telephone Encounter (Signed)
Patient seen for AWV and is asking for a refill of Diclofenac for joint pain.  Is also asking is there a medication that she can take as a maintenance med for cold sore prevention.  Had to recently go to urgent care and was given course of Valtrex.

## 2023-03-03 NOTE — Progress Notes (Signed)
Subjective:   Kristen Becker is a 73 y.o. female who presents for Medicare Annual (Subsequent) preventive examination.  Visit Complete: Virtual I connected with  Richarda Overlie on 03/03/23 by a audio enabled telemedicine application and verified that I am speaking with the correct person using two identifiers.  Patient Location: Home  Provider Location: Home Office  This patient declined Interactive audio and video telecommunications. Therefore the visit was completed with audio only.  I discussed the limitations of evaluation and management by telemedicine. The patient expressed understanding and agreed to proceed.  Vital Signs: Because this visit was a virtual/telehealth visit, some criteria may be missing or patient reported. Any vitals not documented were not able to be obtained and vitals that have been documented are patient reported.  Patient Medicare AWV questionnaire was completed by the patient on 03/01/23; I have confirmed that all information answered by patient is correct and no changes since this date.  Cardiac Risk Factors include: advanced age (>62men, >15 women);dyslipidemia     Objective:    Today's Vitals   03/03/23 1239  Weight: 201 lb (91.2 kg)  Height: 5\' 5"  (1.651 m)   Body mass index is 33.45 kg/m.     03/03/2023   12:43 PM 03/01/2022   11:24 AM 02/11/2021   10:55 AM 02/11/2020   12:26 PM 05/24/2016    3:51 PM  Advanced Directives  Does Patient Have a Medical Advance Directive? No No No No No  Would patient like information on creating a medical advance directive? Yes (MAU/Ambulatory/Procedural Areas - Information given) No - Patient declined No - Patient declined Yes (MAU/Ambulatory/Procedural Areas - Information given) Yes (MAU/Ambulatory/Procedural Areas - Information given)    Current Medications (verified) Outpatient Encounter Medications as of 03/03/2023  Medication Sig   Astaxanthin 4 MG CAPS Take 1 tablet by mouth.   Biotin 5000 MCG CAPS     D-Mannose 500 MG CAPS    fluticasone (FLONASE) 50 MCG/ACT nasal spray Place 1 spray into both nostrils 2 (two) times daily as needed for allergies or rhinitis.   Methylsulfonylmethane 1000 MG TABS Take 4 tablets by mouth daily.   Nutritional Supplements (VITAMIN D BOOSTER PO) Take 7,500 Units by mouth.   ondansetron (ZOFRAN-ODT) 4 MG disintegrating tablet Take 1 tablet (4 mg total) by mouth every 8 (eight) hours as needed for nausea or vomiting.   tiZANidine (ZANAFLEX) 4 MG tablet TAKE 1 TABLET (4 MG) BY MOUTH EVERY 8 (EIGHT) HOURS IF NEEDED FOR MUSCLE SPASMS FOR UP TO 14 DAYS.   trimethoprim (TRIMPEX) 100 MG tablet Take 100 mg by mouth daily.    valACYclovir (VALTREX) 500 MG tablet Take 500 mg by mouth 2 (two) times daily.   vitamin B-12 (CYANOCOBALAMIN) 250 MCG tablet Take 250 mcg by mouth daily. MITO, B Complex   vitamin C (ASCORBIC ACID) 250 MG tablet Take 250 mg by mouth daily. With Lavitol DHQ   diclofenac (VOLTAREN) 75 MG EC tablet TAKE 1 TABLET BY MOUTH WITH BREAKFAST AND WITH EVENING MEAL (Patient not taking: Reported on 03/03/2023)   [DISCONTINUED] COD LIVER OIL PO Take 460 mg by mouth daily. With omega 3 and vit A&D   [DISCONTINUED] Lactobacillus (PROBIOTIC ACIDOPHILUS PO) Take by mouth.   No facility-administered encounter medications on file as of 03/03/2023.    Allergies (verified) Codeine and Sulfa antibiotics   History: Past Medical History:  Diagnosis Date   Allergy    Hyperlipidemia 03/15/2022   Lumbar vertebral fracture (HCC)    L1 from a  fall.   Menopause    Neuropathy    seen spine specialist  - no symptoms of neuropathy   Sleep apnea 2010   CPAP    TMJ (temporomandibular joint syndrome)    Past Surgical History:  Procedure Laterality Date   DILATION AND CURETTAGE OF UTERUS  twice  - last was 1994   TONSILLECTOMY     Family History  Problem Relation Age of Onset   Heart disease Mother        CHF   Cancer Father        prostate   Heart disease Father     Hypertension Father    Neuropathy Brother    Irregular heart beat Brother    Arthritis Brother        rhemotoid and seudo gout   Neuropathy Brother    Arrhythmia Brother    Neuropathy Brother    Neuropathy Brother    COPD Brother    Neuropathy Brother    Kidney Stones Son    Diabetes Maternal Grandmother    Stroke Maternal Grandfather    Social History   Socioeconomic History   Marital status: Married    Spouse name: Dorene Sorrow   Number of children: 2   Years of education: Not on file   Highest education level: Not on file  Occupational History   Occupation: retired  Tobacco Use   Smoking status: Never   Smokeless tobacco: Never  Vaping Use   Vaping status: Never Used  Substance and Sexual Activity   Alcohol use: No   Drug use: No   Sexual activity: Not Currently  Other Topics Concern   Not on file  Social History Narrative   Not on file   Social Drivers of Health   Financial Resource Strain: Low Risk  (03/03/2023)   Overall Financial Resource Strain (CARDIA)    Difficulty of Paying Living Expenses: Not hard at all  Food Insecurity: No Food Insecurity (03/03/2023)   Hunger Vital Sign    Worried About Running Out of Food in the Last Year: Never true    Ran Out of Food in the Last Year: Never true  Transportation Needs: No Transportation Needs (03/03/2023)   PRAPARE - Administrator, Civil Service (Medical): No    Lack of Transportation (Non-Medical): No  Physical Activity: Sufficiently Active (03/03/2023)   Exercise Vital Sign    Days of Exercise per Week: 5 days    Minutes of Exercise per Session: 30 min  Recent Concern: Physical Activity - Insufficiently Active (02/15/2023)   Received from [facility]   Exercise Vital Sign    Days of Exercise per Week: 3 days    Minutes of Exercise per Session: 30 min  Stress: No Stress Concern Present (03/03/2023)   [employer] of Occupational Health - Occupational Stress Questionnaire    Feeling of Stress : Not  at all  Social Connections: Moderately Integrated (03/03/2023)   Social Connection and Isolation Panel [NHANES]    Frequency of Communication with Friends and Family: More than three times a week    Frequency of Social Gatherings with Friends and Family: Three times a week    Attends Religious Services: More than 4 times per year    Active Member of Clubs or Organizations: No    Attends Banker Meetings: Never    Marital Status: Married    Tobacco Counseling Counseling given: Not Answered   Clinical Intake:  Pre-visit preparation completed: Yes  Pain :  No/denies pain     Diabetes: No  How often do you need to have someone help you when you read instructions, pamphlets, or other written materials from your doctor or pharmacy?: 1 - Never  Interpreter Needed?: No  Information entered by :: Kandis Fantasia LPN   Activities of Daily Living    03/01/2023    3:21 PM  In your present state of health, do you have any difficulty performing the following activities:  Hearing? 0  Vision? 0  Difficulty concentrating or making decisions? 0  Walking or climbing stairs? 0  Dressing or bathing? 0  Doing errands, shopping? 0  Preparing Food and eating ? N  Using the Toilet? N  In the past six months, have you accidently leaked urine? Y  Do you have problems with loss of bowel control? N  Managing your Medications? N  Managing your Finances? N  Housekeeping or managing your Housekeeping? N    Patient Care Team: Dettinger, Elige Radon, MD as PCP - General (Family Medicine) Theodosia Blender, MD as Referring Physician (Urology) Eber Jones, MD as Referring Physician (Ophthalmology) Webb Laws, MD as Referring Physician (Specialist) Tia Masker, MD (Physical Medicine and Rehabilitation) Rinaldo Ratel, MD as Referring Physician (Gastroenterology)  Indicate any recent Medical Services you may have received from other than [facility] providers in the past year (date  may be approximate).     Assessment:   This is a routine wellness examination for Kristen Becker.  Hearing/Vision screen Hearing Screening - Comments:: Denies hearing difficulties   Vision Screening - Comments:: Wears rx glasses - up to date with routine eye exams with [facility]     Goals Addressed             This Visit's Progress    COMPLETED: AWV       02/11/2020 AWV Goal: Fall Prevention  Over the next year, patient will decrease their risk for falls by: Using assistive devices, such as a cane or walker, as needed Identifying fall risks within their home and correcting them by: Removing throw rugs Adding handrails to stairs or ramps Removing clutter and keeping a clear pathway throughout the home Increasing light, especially at night Adding shower handles/bars Raising toilet seat Identifying potential personal risk factors for falls: Medication side effects Incontinence/urgency Vestibular dysfunction Hearing loss Musculoskeletal disorders Neurological disorders Orthostatic hypotension       Remain active and independent        Depression Screen    03/03/2023   12:42 PM 05/17/2022   11:45 AM 03/15/2022   10:02 AM 03/01/2022   11:23 AM 03/12/2021    2:03 PM 02/11/2021    2:54 PM 03/05/2020   11:12 AM  PHQ 2/9 Scores  PHQ - 2 Score 0 0 0 0 0 0 0  PHQ- 9 Score  0 0  0      Fall Risk    03/01/2023    3:21 PM 05/17/2022   11:45 AM 03/15/2022   10:02 AM 03/01/2022   11:17 AM 03/12/2021    2:03 PM  Fall Risk   Falls in the past year? 0 0 0 0 0  Number falls in past yr: 0   0   Injury with Fall? 0   0   Risk for fall due to : No Fall Risks   No Fall Risks   Follow up Falls prevention discussed;Education provided;Falls evaluation completed   Falls prevention discussed     MEDICARE RISK AT HOME:  Medicare Risk at Home Any stairs in or around the home?: (Patient-Rptd) Yes If so, are there any without handrails?: (Patient-Rptd) No Home free of loose throw rugs  in walkways, pet beds, electrical cords, etc?: (Patient-Rptd) No Adequate lighting in your home to reduce risk of falls?: (Patient-Rptd) Yes Life alert?: (Patient-Rptd) No Use of a cane, walker or w/c?: (Patient-Rptd) No Grab bars in the bathroom?: (Patient-Rptd) No Shower chair or bench in shower?: (Patient-Rptd) No Elevated toilet seat or a handicapped toilet?: (Patient-Rptd) No  TIMED UP AND GO:  Was the test performed?  No    Cognitive Function:    05/24/2016    3:53 PM  MMSE - Mini Mental State Exam  Orientation to time 5  Orientation to Place 5  Registration 3  Attention/ Calculation 5  Recall 3  Language- name 2 objects 2  Language- repeat 1  Language- follow 3 step command 3  Language- read & follow direction 1  Write a sentence 1  Copy design 1  Total score 30        03/03/2023   12:43 PM 03/01/2022   11:25 AM 02/11/2020   12:00 PM  6CIT Screen  What Year? 0 points 0 points 0 points  What month? 0 points 0 points 0 points  What time? 0 points 0 points 0 points  Count back from 20 0 points 0 points 0 points  Months in reverse 0 points 0 points 0 points  Repeat phrase 0 points 0 points 0 points  Total Score 0 points 0 points 0 points    Immunizations Immunization History  Administered Date(s) Administered   Fluad Quad(high Dose 65+) 01/05/2021, 11/23/2021   Fluad Trivalent(High Dose 65+) 01/20/2023   Influenza, High Dose Seasonal PF 01/14/2016, 11/22/2016, 01/18/2018, 12/08/2019   Influenza,inj,Quad PF,6+ Mos 12/05/2013   Moderna Sars-Covid-2 Vaccination 03/12/2019, 04/09/2019, 03/05/2020   Pneumococcal Conjugate-13 05/24/2016   Pneumococcal Polysaccharide-23 03/21/2020   Tdap 09/25/2015   Zoster Recombinant(Shingrix) 03/26/2020, 11/05/2020   Zoster, Live 10/25/2013    TDAP status: Up to date  Flu Vaccine status: Up to date  Pneumococcal vaccine status: Up to date  Covid-19 vaccine status: Information provided on how to obtain vaccines.    Qualifies for Shingles Vaccine? Yes   Zostavax completed Yes   Shingrix Completed?: Yes  Screening Tests Health Maintenance  Topic Date Due   COVID-19 Vaccine (4 - 2024-25 season) 10/10/2022   MAMMOGRAM  12/11/2022   Medicare Annual Wellness (AWV)  03/02/2024   DEXA SCAN  03/15/2024   DTaP/Tdap/Td (2 - Td or Tdap) 09/24/2025   Colonoscopy  06/22/2026   Pneumonia Vaccine 66+ Years old  Completed   INFLUENZA VACCINE  Completed   Hepatitis C Screening  Completed   Zoster Vaccines- Shingrix  Completed   HPV VACCINES  Aged Out    Health Maintenance  Health Maintenance Due  Topic Date Due   COVID-19 Vaccine (4 - 2024-25 season) 10/10/2022   MAMMOGRAM  12/11/2022    Colorectal cancer screening: Type of screening: Colonoscopy. Completed 06/21/16. Repeat every 10 years  Mammogram status: Ordered and scheduled with gyn. Pt provided with contact info and advised to call to schedule appt.   Bone Density status: Completed 03/15/22. Results reflect: Bone density results: OSTEOPENIA. Repeat every 2 years.  Lung Cancer Screening: (Low Dose CT Chest recommended if Age 70-80 years, 20 pack-year currently smoking OR have quit w/in 15years.) does not qualify.   Lung Cancer Screening Referral: n/a  Additional Screening:  Hepatitis C Screening:  does qualify; Completed 04/25/15  Vision Screening: Recommended annual ophthalmology exams for early detection of glaucoma and other disorders of the eye. Is the patient up to date with their annual eye exam?  Yes  Who is the provider or what is the name of the office in which the patient attends annual eye exams? [facility]  If pt is not established with a provider, would they like to be referred to a provider to establish care? No .   Dental Screening: Recommended annual dental exams for proper oral hygiene  Community Resource Referral / Chronic Care Management: CRR required this visit?  No   CCM required this visit?  No     Plan:      I have personally reviewed and noted the following in the patient's chart:   Medical and social history Use of alcohol, tobacco or illicit drugs  Current medications and supplements including opioid prescriptions. Patient is not currently taking opioid prescriptions. Functional ability and status Nutritional status Physical activity Advanced directives List of other physicians Hospitalizations, surgeries, and ER visits in previous 12 months Vitals Screenings to include cognitive, depression, and falls Referrals and appointments  In addition, I have reviewed and discussed with patient certain preventive protocols, quality metrics, and best practice recommendations. A written personalized care plan for preventive services as well as general preventive health recommendations were provided to patient.     Kandis Fantasia [place], [place]   1/61/0960   After Visit Summary: (MyChart) Due to this being a telephonic visit, the after visit summary with patients personalized plan was offered to patient via MyChart   Nurse Notes: See telephone note with patient concerns

## 2023-03-03 NOTE — Patient Instructions (Signed)
Kristen Becker , Thank you for taking time to come for your Medicare Wellness Visit. I appreciate your ongoing commitment to your health goals. Please review the following plan we discussed and let me know if I can assist you in the future.   Referrals/Orders/Follow-Ups/Clinician Recommendations: Aim for 30 minutes of exercise or brisk walking, 6-8 glasses of water, and 5 servings of fruits and vegetables each day.  This is a list of the screening recommended for you and due dates:  Health Maintenance  Topic Date Due   COVID-19 Vaccine (4 - 2024-25 season) 10/10/2022   Mammogram  12/11/2022   Medicare Annual Wellness Visit  03/02/2024   DEXA scan (bone density measurement)  03/15/2024   DTaP/Tdap/Td vaccine (2 - Td or Tdap) 09/24/2025   Colon Cancer Screening  06/22/2026   Pneumonia Vaccine  Completed   Flu Shot  Completed   Hepatitis C Screening  Completed   Zoster (Shingles) Vaccine  Completed   HPV Vaccine  Aged Out    Advanced directives: (ACP Link)Information on Advanced Care Planning can be found at [facility] Advance Health Care Directives Advance Health Care Directives ([email or web address]   Next Medicare Annual Wellness Visit scheduled for next year: Yes

## 2023-03-04 MED ORDER — DICLOFENAC SODIUM 75 MG PO TBEC: 75.0000 mg | DELAYED_RELEASE_TABLET | Freq: Two times a day (BID) | ORAL | 1 refills | Status: DC

## 2023-03-04 MED ORDER — VALACYCLOVIR HCL 1 G PO TABS: 1000.0000 mg | ORAL_TABLET | Freq: Every day | ORAL | 0 refills | Status: DC

## 2023-03-04 NOTE — Telephone Encounter (Signed)
Sent that diclofenac refills for her, the maintenance for prevention for cold sores if she wants to take it every day is 1 dose of Valtrex every day.  The only issue with that is sometimes like antibiotics that can cause a little bit of upset stomach but she can try it and see if she is able to take it every day and that can work for prevention.  If she gets a flareup she still will increase it to twice a day

## 2023-03-17 ENCOUNTER — Encounter: Payer: Self-pay | Admitting: Family Medicine

## 2023-03-17 ENCOUNTER — Ambulatory Visit: Payer: Medicare Other | Admitting: Family Medicine

## 2023-03-17 VITALS — BP 111/73 | HR 74 | Ht 65.0 in | Wt 204.0 lb

## 2023-03-17 DIAGNOSIS — Z0000 Encounter for general adult medical examination without abnormal findings: Secondary | ICD-10-CM

## 2023-03-17 DIAGNOSIS — E7800 Pure hypercholesterolemia, unspecified: Secondary | ICD-10-CM

## 2023-03-17 DIAGNOSIS — Z6833 Body mass index (BMI) 33.0-33.9, adult: Secondary | ICD-10-CM | POA: Diagnosis not present

## 2023-03-17 DIAGNOSIS — M85851 Other specified disorders of bone density and structure, right thigh: Secondary | ICD-10-CM

## 2023-03-17 DIAGNOSIS — E66811 Obesity, class 1: Secondary | ICD-10-CM | POA: Diagnosis not present

## 2023-03-17 NOTE — Progress Notes (Signed)
 BP 111/73   Pulse 74   Ht 5' 5 (1.651 m)   Wt 204 lb (92.5 kg)   SpO2 97%   BMI 33.95 kg/m    Subjective:   Patient ID: Kristen Becker, female    DOB: [birth year], 73 y.o.   MRN: [id number]  HPI: Kristen Becker is a 73 y.o. female presenting on 03/17/2023 for Medical Management of Chronic Issues (CPE, no pap. Mammo scheduled)   HPI Physical exam Patient denies any chest pain, shortness of breath, headaches or vision issues, abdominal complaints, diarrhea, nausea, vomiting, or joint issues.   Hyperlipidemia Patient is coming in for recheck of his hyperlipidemia. The patient is currently taking no medication currently, trying for diet. They deny any issues with myalgias or history of liver damage from it. They deny any focal numbness or weakness or chest pain.   Osteoporosis/osteopenia Fractures or history of fracture: No fractures Medication: None currently, take calcium and vitamin D  Duration of treatment: 4 years Last bone density scan: 1 year ago Last T score: -2.0  Relevant past medical, surgical, family and social history reviewed and updated as indicated. Interim medical history since our last visit reviewed. Allergies and medications reviewed and updated.  Review of Systems  Constitutional:  Negative for chills and fever.  HENT:  Negative for congestion, ear discharge, ear pain and tinnitus.   Eyes:  Negative for pain, redness and visual disturbance.  Respiratory:  Negative for cough, chest tightness, shortness of breath and wheezing.   Cardiovascular:  Negative for chest pain, palpitations and leg swelling.  Gastrointestinal:  Negative for abdominal pain, blood in stool, constipation and diarrhea.  Genitourinary:  Negative for difficulty urinating, dysuria and hematuria.  Musculoskeletal:  Negative for back pain, gait problem and myalgias.  Skin:  Negative for rash.  Neurological:  Negative for dizziness, weakness, light-headedness and headaches.   Psychiatric/Behavioral:  Negative for agitation, behavioral problems and suicidal ideas.   All other systems reviewed and are negative.   Per HPI unless specifically indicated above   Allergies as of 03/17/2023       Reactions   Codeine Rash   Sulfa Antibiotics Rash        Medication List        Accurate as of March 17, 2023 10:27 AM. If you have any questions, ask your nurse or doctor.          STOP taking these medications    tiZANidine 4 MG tablet Commonly known as: ZANAFLEX Stopped by: Fonda LABOR Nicklos Gaxiola       TAKE these medications    Astaxanthin 4 MG Caps Take 1 tablet by mouth.   Biotin 5000 MCG Caps   D-Mannose 500 MG Caps   diclofenac  75 MG EC tablet Commonly known as: VOLTAREN  Take 1 tablet (75 mg total) by mouth 2 (two) times daily.   fluticasone  50 MCG/ACT nasal spray Commonly known as: FLONASE  Place 1 spray into both nostrils 2 (two) times daily as needed for allergies or rhinitis.   Methylsulfonylmethane 1000 MG Tabs Take 4 tablets by mouth daily.   ondansetron  4 MG disintegrating tablet Commonly known as: ZOFRAN -ODT Take 1 tablet (4 mg total) by mouth every 8 (eight) hours as needed for nausea or vomiting.   trimethoprim 100 MG tablet Commonly known as: TRIMPEX Take 100 mg by mouth daily.   valACYclovir  1000 MG tablet Commonly known as: VALTREX  Take 1 tablet (1,000 mg total) by mouth daily.   vitamin B-12 250 MCG tablet Commonly known  as: CYANOCOBALAMIN Take 250 mcg by mouth daily. MITO, B Complex   vitamin C 250 MG tablet Commonly known as: ASCORBIC ACID Take 250 mg by mouth daily. With Lavitol DHQ   VITAMIN D  BOOSTER PO Take 7,500 Units by mouth.         Objective:   BP 111/73   Pulse 74   Ht 5' 5 (1.651 m)   Wt 204 lb (92.5 kg)   SpO2 97%   BMI 33.95 kg/m   Wt Readings from Last 3 Encounters:  03/17/23 204 lb (92.5 kg)  03/03/23 201 lb (91.2 kg)  05/17/22 201 lb (91.2 kg)    Physical Exam Vitals and  nursing note reviewed.  Constitutional:      General: She is not in acute distress.    Appearance: Normal appearance. She is well-developed. She is not diaphoretic.  HENT:     Right Ear: Tympanic membrane and ear canal normal.     Left Ear: Tympanic membrane and ear canal normal.     Mouth/Throat:     Mouth: Mucous membranes are moist.     Pharynx: Oropharynx is clear. No oropharyngeal exudate or posterior oropharyngeal erythema.  Eyes:     Conjunctiva/sclera: Conjunctivae normal.  Cardiovascular:     Rate and Rhythm: Normal rate and regular rhythm.     Heart sounds: Normal heart sounds. No murmur heard. Pulmonary:     Effort: Pulmonary effort is normal. No respiratory distress.     Breath sounds: Normal breath sounds. No wheezing.  Abdominal:     General: Abdomen is flat. Bowel sounds are normal. There is no distension.     Palpations: Abdomen is soft.     Tenderness: There is no abdominal tenderness. There is no guarding or rebound.  Musculoskeletal:        General: No tenderness. Normal range of motion.  Skin:    General: Skin is warm and dry.     Findings: No rash.  Neurological:     Mental Status: She is alert and oriented to person, place, and time.     Coordination: Coordination normal.  Psychiatric:        Behavior: Behavior normal.       Assessment & Plan:   Problem List Items Addressed This Visit       Musculoskeletal and Integument   Osteopenia   Relevant Orders   CMP14+EGFR   Lipid panel     Other   Hyperlipidemia   Relevant Orders   CMP14+EGFR   Lipid panel   Obesity   Other Visit Diagnoses       Physical exam    -  Primary   Relevant Orders   CBC with Differential/Platelet   CMP14+EGFR   Lipid panel       Continue current medicine, will check blood work today.  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  If fatty liver is still present and cholesterol is still elevated then we may consider starting  rosuvastatin 5 mg daily.  Also encourage weight loss diet and increased activity. Follow up plan: Return in about 1 year (around 03/16/2024), or if symptoms worsen or fail to improve, for Physical exam and cholesterol recheck.  Counseling provided for all of the vaccine components Orders Placed This Encounter  Procedures   CBC with Differential/Platelet   CMP14+EGFR   Lipid panel    Fonda Levins, MD [facility] 03/17/2023, 10:27 AM

## 2023-03-18 LAB — CBC WITH DIFFERENTIAL/PLATELET
Basophils Absolute: 0.1 10*3/uL (ref 0.0–0.2)
Basos: 1 %
EOS (ABSOLUTE): 0.1 10*3/uL (ref 0.0–0.4)
Eos: 3 %
Hematocrit: 39.4 % (ref 34.0–46.6)
Hemoglobin: 12.9 g/dL (ref 11.1–15.9)
Immature Grans (Abs): 0 10*3/uL (ref 0.0–0.1)
Immature Granulocytes: 0 %
Lymphocytes Absolute: 1.7 10*3/uL (ref 0.7–3.1)
Lymphs: 31 %
MCH: 30.1 pg (ref 26.6–33.0)
MCHC: 32.7 g/dL (ref 31.5–35.7)
MCV: 92 fL (ref 79–97)
Monocytes Absolute: 0.4 10*3/uL (ref 0.1–0.9)
Monocytes: 7 %
Neutrophils Absolute: 3.3 10*3/uL (ref 1.4–7.0)
Neutrophils: 58 %
Platelets: 165 10*3/uL (ref 150–450)
RBC: 4.28 x10E6/uL (ref 3.77–5.28)
RDW: 13.6 % (ref 11.7–15.4)
WBC: 5.7 10*3/uL (ref 3.4–10.8)

## 2023-03-18 LAB — CMP14+EGFR
ALT: 18 [IU]/L (ref 0–32)
AST: 19 [IU]/L (ref 0–40)
Albumin: 4.3 g/dL (ref 3.8–4.8)
Alkaline Phosphatase: 92 [IU]/L (ref 44–121)
BUN/Creatinine Ratio: 22 (ref 12–28)
BUN: 20 mg/dL (ref 8–27)
Bilirubin Total: 0.3 mg/dL (ref 0.0–1.2)
CO2: 21 mmol/L (ref 20–29)
Calcium: 9.3 mg/dL (ref 8.7–10.3)
Chloride: 106 mmol/L (ref 96–106)
Creatinine, Ser: 0.93 mg/dL (ref 0.57–1.00)
Globulin, Total: 2 g/dL (ref 1.5–4.5)
Glucose: 97 mg/dL (ref 70–99)
Potassium: 4.4 mmol/L (ref 3.5–5.2)
Sodium: 143 mmol/L (ref 134–144)
Total Protein: 6.3 g/dL (ref 6.0–8.5)
eGFR: 65 mL/min/{1.73_m2} (ref >59)

## 2023-03-18 LAB — LIPID PANEL
Chol/HDL Ratio: 2.6 {ratio} (ref 0.0–4.4)
Cholesterol, Total: 237 mg/dL — ABNORMAL HIGH (ref 100–199)
HDL: 90 mg/dL (ref >39)
LDL Chol Calc (NIH): 131 mg/dL — ABNORMAL HIGH (ref 0–99)
Triglycerides: 96 mg/dL (ref 0–149)
VLDL Cholesterol Cal: 16 mg/dL (ref 5–40)

## 2023-03-22 DIAGNOSIS — L304 Erythema intertrigo: Secondary | ICD-10-CM | POA: Diagnosis not present

## 2023-03-22 DIAGNOSIS — L821 Other seborrheic keratosis: Secondary | ICD-10-CM | POA: Diagnosis not present

## 2023-03-22 DIAGNOSIS — D3617 Benign neoplasm of peripheral nerves and autonomic nervous system of trunk, unspecified: Secondary | ICD-10-CM | POA: Diagnosis not present

## 2023-03-22 DIAGNOSIS — D485 Neoplasm of uncertain behavior of skin: Secondary | ICD-10-CM | POA: Diagnosis not present

## 2023-03-22 DIAGNOSIS — I781 Nevus, non-neoplastic: Secondary | ICD-10-CM | POA: Diagnosis not present

## 2023-03-24 ENCOUNTER — Encounter: Payer: Self-pay | Admitting: Family Medicine

## 2023-03-24 NOTE — Addendum Note (Signed)
Addended by: Arville Care on: 03/24/2023 03:21 PM   Modules accepted: Level of Service

## 2023-05-12 DIAGNOSIS — Z124 Encounter for screening for malignant neoplasm of cervix: Secondary | ICD-10-CM | POA: Diagnosis not present

## 2023-05-12 DIAGNOSIS — Z1389 Encounter for screening for other disorder: Secondary | ICD-10-CM | POA: Diagnosis not present

## 2023-05-12 DIAGNOSIS — M2540 Effusion, unspecified joint: Secondary | ICD-10-CM | POA: Diagnosis not present

## 2023-05-12 DIAGNOSIS — Z1231 Encounter for screening mammogram for malignant neoplasm of breast: Secondary | ICD-10-CM | POA: Diagnosis not present

## 2023-05-12 LAB — HM MAMMOGRAPHY

## 2023-05-17 LAB — HM PAP SMEAR

## 2023-05-30 ENCOUNTER — Other Ambulatory Visit: Payer: Self-pay | Admitting: Family Medicine

## 2023-06-14 DIAGNOSIS — H2513 Age-related nuclear cataract, bilateral: Secondary | ICD-10-CM | POA: Diagnosis not present

## 2023-06-14 DIAGNOSIS — H04123 Dry eye syndrome of bilateral lacrimal glands: Secondary | ICD-10-CM | POA: Diagnosis not present

## 2023-08-25 ENCOUNTER — Other Ambulatory Visit: Payer: Self-pay | Admitting: Family Medicine

## 2023-09-09 DIAGNOSIS — M79672 Pain in left foot: Secondary | ICD-10-CM | POA: Diagnosis not present

## 2023-09-09 DIAGNOSIS — M79671 Pain in right foot: Secondary | ICD-10-CM | POA: Diagnosis not present

## 2023-09-23 ENCOUNTER — Other Ambulatory Visit: Payer: Self-pay | Admitting: Family Medicine

## 2023-10-21 DIAGNOSIS — M79672 Pain in left foot: Secondary | ICD-10-CM | POA: Diagnosis not present

## 2023-10-24 DIAGNOSIS — L280 Lichen simplex chronicus: Secondary | ICD-10-CM | POA: Diagnosis not present

## 2023-11-09 DIAGNOSIS — G4733 Obstructive sleep apnea (adult) (pediatric): Secondary | ICD-10-CM | POA: Diagnosis not present

## 2023-11-11 ENCOUNTER — Ambulatory Visit (INDEPENDENT_AMBULATORY_CARE_PROVIDER_SITE_OTHER)

## 2023-11-11 DIAGNOSIS — Z23 Encounter for immunization: Secondary | ICD-10-CM | POA: Diagnosis not present

## 2024-03-08 ENCOUNTER — Ambulatory Visit

## 2024-03-08 VITALS — BP 111/73 | HR 74 | Ht 65.0 in | Wt 204.0 lb

## 2024-03-08 DIAGNOSIS — Z0000 Encounter for general adult medical examination without abnormal findings: Secondary | ICD-10-CM | POA: Diagnosis not present

## 2024-03-08 NOTE — Patient Instructions (Signed)
 Ms. Wisor,  Thank you for taking the time for your Medicare Wellness Visit. I appreciate your continued commitment to your health goals. Please review the care plan we discussed, and feel free to reach out if I can assist you further.  Please note that Annual Wellness Visits do not include a physical exam. Some assessments may be limited, especially if the visit was conducted virtually. If needed, we may recommend an in-person follow-up with your provider.  Ongoing Care Seeing your primary care provider every 3 to 6 months helps us  monitor your health and provide consistent, personalized care.   Referrals If a referral was made during today's visit and you haven't received any updates within two weeks, please contact the referred provider directly to check on the status.  Recommended Screenings:  Health Maintenance  Topic Date Due   COVID-19 Vaccine (4 - 2025-26 season) 10/10/2023   Medicare Annual Wellness Visit  03/02/2024   Osteoporosis screening with Bone Density Scan  03/15/2024   Breast Cancer Screening  05/11/2024   DTaP/Tdap/Td vaccine (2 - Td or Tdap) 09/24/2025   Colon Cancer Screening  06/22/2026   Pneumococcal Vaccine for age over 68  Completed   Flu Shot  Completed   Hepatitis C Screening  Completed   Zoster (Shingles) Vaccine  Completed   Meningitis B Vaccine  Aged Out       03/08/2024    1:40 PM  Advanced Directives  Does Patient Have a Medical Advance Directive? No  Would patient like information on creating a medical advance directive? No - Patient declined    Vision: Annual vision screenings are recommended for early detection of glaucoma, cataracts, and diabetic retinopathy. These exams can also reveal signs of chronic conditions such as diabetes and high blood pressure.  Dental: Annual dental screenings help detect early signs of oral cancer, gum disease, and other conditions linked to overall health, including heart disease and diabetes.  Please see the  attached documents for additional preventive care recommendations.

## 2024-03-08 NOTE — Progress Notes (Addendum)
 "  Chief Complaint  Patient presents with   Medicare Wellness     Subjective:   Kristen Becker is a 74 y.o. female who presents for a Medicare Annual Wellness Visit.  Visit info / Clinical Intake: Medicare Wellness Visit Type:: Subsequent Annual Wellness Visit Persons participating in visit and providing information:: patient Medicare Wellness Visit Mode:: Telephone If telephone:: video declined Since this visit was completed virtually, some vitals may be partially provided or unavailable. Missing vitals are due to the limitations of the virtual format.: Unable to obtain vitals - no equipment If Telephone or Video please confirm:: I connected with patient using audio/video enable telemedicine. I verified patient identity with two identifiers, discussed telehealth limitations, and patient agreed to proceed. Patient Location:: home Provider Location:: home office Interpreter Needed?: No Pre-visit prep was completed: yes AWV questionnaire completed by patient prior to visit?: no Living arrangements:: lives with spouse/significant other Patient's Overall Health Status Rating: very good Typical amount of pain: some (pain on her l-foot) Does pain affect daily life?: no Are you currently prescribed opioids?: no  Dietary Habits and Nutritional Risks How many meals a day?: 3 Eats fruit and vegetables daily?: yes Most meals are obtained by: preparing own meals In the last 2 weeks, have you had any of the following?: none Diabetic:: no  Functional Status Activities of Daily Living (to include ambulation/medication): Independent Ambulation: Independent Medication Administration: Independent Home Management (perform basic housework or laundry): Independent Manage your own finances?: yes Primary transportation is: driving Concerns about vision?: no *vision screening is required for WTM* (last ov 2025) Concerns about hearing?: no  Fall Screening Falls in the past year?: 0 Number of  falls in past year: 0 Was there an injury with Fall?: 0 Fall Risk Category Calculator: 0 Patient Fall Risk Level: Low Fall Risk  Fall Risk Patient at Risk for Falls Due to: No Fall Risks Fall risk Follow up: Falls evaluation completed; Education provided  Home and Transportation Safety: All rugs have non-skid backing?: yes All stairs or steps have railings?: yes Grab bars in the bathtub or shower?: yes Have non-skid surface in bathtub or shower?: yes Good home lighting?: yes Regular seat belt use?: yes Hospital stays in the last year:: no  Cognitive Assessment Difficulty concentrating, remembering, or making decisions? : no Will 6CIT or Mini Cog be Completed: yes What year is it?: 0 points What month is it?: 0 points Give patient an address phrase to remember (5 components): [address] [place] About what time is it?: 0 points Count backwards from 20 to 1: 0 points Say the months of the year in reverse: 0 points Repeat the address phrase from earlier: 0 points 6 CIT Score: 0 points  Advance Directives (For Healthcare) Does Patient Have a Medical Advance Directive?: No Would patient like information on creating a medical advance directive?: No - Patient declined  Reviewed/Updated  Reviewed/Updated: Reviewed All (Medical, Surgical, Family, Medications, Allergies, Care Teams, Patient Goals)    Allergies (verified) Codeine and Sulfa antibiotics   Current Medications (verified) Outpatient Encounter Medications as of 03/08/2024  Medication Sig   Astaxanthin 4 MG CAPS Take 1 tablet by mouth.   Biotin 5000 MCG CAPS    D-Mannose 500 MG CAPS    diclofenac  (VOLTAREN ) 75 MG EC tablet TAKE 1 TABLET BY MOUTH TWICE A DAY   fluticasone  (FLONASE ) 50 MCG/ACT nasal spray Place 1 spray into both nostrils 2 (two) times daily as needed for allergies or rhinitis.   Methylsulfonylmethane 1000 MG  TABS Take 4 tablets by mouth daily.   Nutritional Supplements (VITAMIN D  BOOSTER PO) Take  7,500 Units by mouth.   ondansetron  (ZOFRAN -ODT) 4 MG disintegrating tablet Take 1 tablet (4 mg total) by mouth every 8 (eight) hours as needed for nausea or vomiting.   trimethoprim (TRIMPEX) 100 MG tablet Take 100 mg by mouth daily.    valACYclovir  (VALTREX ) 1000 MG tablet TAKE 1 TABLET BY MOUTH EVERY DAY   vitamin B-12 (CYANOCOBALAMIN) 250 MCG tablet Take 250 mcg by mouth daily. MITO, B Complex   vitamin C (ASCORBIC ACID) 250 MG tablet Take 250 mg by mouth daily. With Lavitol DHQ   No facility-administered encounter medications on file as of 03/08/2024.    History: Past Medical History:  Diagnosis Date   Allergy    Hyperlipidemia 03/15/2022   Lumbar vertebral fracture (HCC)    L1 from a fall.   Menopause    Neuropathy    seen spine specialist  - no symptoms of neuropathy   Sleep apnea 2010   CPAP    TMJ (temporomandibular joint syndrome)    Past Surgical History:  Procedure Laterality Date   DILATION AND CURETTAGE OF UTERUS  twice  - last was 1994   TONSILLECTOMY     Family History  Problem Relation Age of Onset   Heart disease Mother        CHF   Cancer Father        prostate   Heart disease Father    Hypertension Father    Neuropathy Brother    Irregular heart beat Brother    Arthritis Brother        rhemotoid and seudo gout   Neuropathy Brother    Arrhythmia Brother    Neuropathy Brother    Neuropathy Brother    COPD Brother    Neuropathy Brother    Kidney Stones Son    Diabetes Maternal Grandmother    Stroke Maternal Grandfather    Social History   Occupational History   Occupation: retired  Tobacco Use   Smoking status: Never   Smokeless tobacco: Never  Vaping Use   Vaping status: Never Used  Substance and Sexual Activity   Alcohol use: No   Drug use: No   Sexual activity: Not Currently   Tobacco Counseling Counseling given: Yes  SDOH Screenings   Food Insecurity: No Food Insecurity (03/08/2024)  Housing: Unknown (03/08/2024)  Transportation  Needs: No Transportation Needs (03/08/2024)  Utilities: Not At Risk (03/08/2024)  Alcohol Screen: Low Risk (03/03/2023)  Depression (PHQ2-9): Low Risk (03/08/2024)  Financial Resource Strain: Low Risk (02/13/2024)   Received from [facility]  Physical Activity: Sufficiently Active (03/08/2024)  Social Connections: Moderately Integrated (03/08/2024)  Stress: No Stress Concern Present (03/08/2024)  Tobacco Use: Low Risk (03/08/2024)  Health Literacy: Adequate Health Literacy (03/08/2024)   See flowsheets for full screening details  Depression Screen PHQ 2 & 9 Depression Scale- Over the past 2 weeks, how often have you been bothered by any of the following problems? Little interest or pleasure in doing things: 0 Feeling down, depressed, or hopeless (PHQ Adolescent also includes...irritable): 0 PHQ-2 Total Score: 0     Goals Addressed             This Visit's Progress    Remain active and independent   On track            Objective:    Today's Vitals   03/08/24 1412  BP: 111/73  Pulse: 74  Weight: 204 lb (92.5 kg)  Height: 5' 5 (1.651 m)   Body mass index is 33.95 kg/m.  Hearing/Vision screen No results found. Immunizations and Health Maintenance Health Maintenance  Topic Date Due   COVID-19 Vaccine (4 - 2025-26 season) 10/10/2023   Bone Density Scan  03/15/2024   Mammogram  05/11/2024   Medicare Annual Wellness (AWV)  03/08/2025   DTaP/Tdap/Td (2 - Td or Tdap) 09/24/2025   Colonoscopy  06/22/2026   Pneumococcal Vaccine: 50+ Years  Completed   Influenza Vaccine  Completed   Hepatitis C Screening  Completed   Zoster Vaccines- Shingrix  Completed   Meningococcal B Vaccine  Aged Out        Assessment/Plan:  This is a routine wellness examination for [place].  Patient Care Team: Dettinger, Fonda LABOR, MD as PCP - General (Family Medicine) Tanda Norleen HERO, MD as Referring Physician (Urology) Maree Paticia BRAVO, MD as Referring Physician (Ophthalmology) Aletta Ester,  MD as Referring Physician (Specialist) Amelie Eastern, MD as Referring Physician (Gastroenterology) Dr. Stanford as Consulting Physician (Orthopedic Surgery) Dr. Joyce as Consulting Physician (Orthopedic Surgery)  I have personally reviewed and noted the following in the patients chart:   Medical and social history Use of alcohol, tobacco or illicit drugs  Current medications and supplements including opioid prescriptions. Functional ability and status Nutritional status Physical activity Advanced directives List of other physicians Hospitalizations, surgeries, and ER visits in previous 12 months Vitals Screenings to include cognitive, depression, and falls Referrals and appointments  No orders of the defined types were placed in this encounter.  In addition, I have reviewed and discussed with patient certain preventive protocols, quality metrics, and best practice recommendations. A written personalized care plan for preventive services as well as general preventive health recommendations were provided to patient.   Ozie Ned, CMA   03/08/2024   Return in 1 year (on 03/08/2025).  After Visit Summary: (MyChart) Due to this being a telephonic visit, the after visit summary with patients personalized plan was offered to patient via MyChart   Nurse Notes: n/a "

## 2024-03-19 ENCOUNTER — Other Ambulatory Visit: Payer: Self-pay

## 2024-03-19 ENCOUNTER — Encounter: Payer: Medicare Other | Admitting: Family Medicine
